# Patient Record
Sex: Female | Born: 1940 | Race: White | Hispanic: No | Smoking: Never smoker
Health system: Southern US, Community
[De-identification: ages and names within clinical notes are randomized; demographics above are authoritative.]

## PROBLEM LIST (undated history)

## (undated) DIAGNOSIS — I10 Essential (primary) hypertension: Secondary | ICD-10-CM

## (undated) DIAGNOSIS — M5431 Sciatica, right side: Secondary | ICD-10-CM

## (undated) DIAGNOSIS — H409 Unspecified glaucoma: Secondary | ICD-10-CM

## (undated) DIAGNOSIS — J45909 Unspecified asthma, uncomplicated: Secondary | ICD-10-CM

## (undated) DIAGNOSIS — T7840XA Allergy, unspecified, initial encounter: Secondary | ICD-10-CM

## (undated) DIAGNOSIS — N183 Chronic kidney disease, stage 3 unspecified: Secondary | ICD-10-CM

## (undated) DIAGNOSIS — A809 Acute poliomyelitis, unspecified: Secondary | ICD-10-CM

## (undated) HISTORY — DX: Acute poliomyelitis, unspecified: A80.9

## (undated) HISTORY — DX: Allergy, unspecified, initial encounter: T78.40XA

## (undated) HISTORY — DX: Sciatica, right side: M54.31

## (undated) HISTORY — DX: Chronic kidney disease, stage 3 (moderate): N18.3

## (undated) HISTORY — DX: Chronic kidney disease, stage 3 unspecified: N18.30

## (undated) HISTORY — DX: Unspecified glaucoma: H40.9

## (undated) HISTORY — DX: Unspecified asthma, uncomplicated: J45.909

## (undated) HISTORY — DX: Essential (primary) hypertension: I10

---

## 1992-10-09 HISTORY — PX: ROTATOR CUFF REPAIR: SHX139

## 1999-01-06 ENCOUNTER — Other Ambulatory Visit: Admission: RE | Admit: 1999-01-06 | Discharge: 1999-01-06 | Payer: Self-pay | Admitting: Obstetrics and Gynecology

## 2000-02-14 ENCOUNTER — Other Ambulatory Visit: Admission: RE | Admit: 2000-02-14 | Discharge: 2000-02-14 | Payer: Self-pay | Admitting: Obstetrics and Gynecology

## 2000-10-09 HISTORY — PX: CHOLECYSTECTOMY: SHX55

## 2001-05-09 ENCOUNTER — Other Ambulatory Visit: Admission: RE | Admit: 2001-05-09 | Discharge: 2001-05-09 | Payer: Self-pay | Admitting: Obstetrics and Gynecology

## 2002-04-28 ENCOUNTER — Encounter: Payer: Self-pay | Admitting: Surgery

## 2002-04-30 ENCOUNTER — Encounter (INDEPENDENT_AMBULATORY_CARE_PROVIDER_SITE_OTHER): Payer: Self-pay | Admitting: Specialist

## 2002-04-30 ENCOUNTER — Observation Stay (HOSPITAL_COMMUNITY): Admission: RE | Admit: 2002-04-30 | Discharge: 2002-05-01 | Payer: Self-pay | Admitting: Surgery

## 2002-04-30 ENCOUNTER — Encounter: Payer: Self-pay | Admitting: Surgery

## 2002-05-13 ENCOUNTER — Other Ambulatory Visit: Admission: RE | Admit: 2002-05-13 | Discharge: 2002-05-13 | Payer: Self-pay | Admitting: Obstetrics and Gynecology

## 2003-07-03 ENCOUNTER — Other Ambulatory Visit: Admission: RE | Admit: 2003-07-03 | Discharge: 2003-07-03 | Payer: Self-pay | Admitting: Obstetrics and Gynecology

## 2004-07-05 ENCOUNTER — Other Ambulatory Visit: Admission: RE | Admit: 2004-07-05 | Discharge: 2004-07-05 | Payer: Self-pay | Admitting: Obstetrics and Gynecology

## 2005-03-15 ENCOUNTER — Ambulatory Visit: Payer: Self-pay | Admitting: Family Medicine

## 2005-03-29 ENCOUNTER — Ambulatory Visit: Payer: Self-pay | Admitting: Family Medicine

## 2005-07-07 ENCOUNTER — Other Ambulatory Visit: Admission: RE | Admit: 2005-07-07 | Discharge: 2005-07-07 | Payer: Self-pay | Admitting: Obstetrics and Gynecology

## 2005-12-05 ENCOUNTER — Ambulatory Visit: Payer: Self-pay | Admitting: Family Medicine

## 2005-12-08 ENCOUNTER — Ambulatory Visit: Payer: Self-pay | Admitting: Cardiology

## 2005-12-21 ENCOUNTER — Ambulatory Visit: Payer: Self-pay | Admitting: Gastroenterology

## 2005-12-27 ENCOUNTER — Ambulatory Visit: Payer: Self-pay | Admitting: Gastroenterology

## 2005-12-27 ENCOUNTER — Encounter (INDEPENDENT_AMBULATORY_CARE_PROVIDER_SITE_OTHER): Payer: Self-pay | Admitting: *Deleted

## 2006-03-27 ENCOUNTER — Ambulatory Visit: Payer: Self-pay | Admitting: Family Medicine

## 2006-08-09 LAB — CONVERTED CEMR LAB: Pap Smear: NORMAL

## 2007-05-24 DIAGNOSIS — I10 Essential (primary) hypertension: Secondary | ICD-10-CM | POA: Insufficient documentation

## 2007-05-24 DIAGNOSIS — A809 Acute poliomyelitis, unspecified: Secondary | ICD-10-CM | POA: Insufficient documentation

## 2007-05-24 DIAGNOSIS — E785 Hyperlipidemia, unspecified: Secondary | ICD-10-CM | POA: Insufficient documentation

## 2007-09-17 ENCOUNTER — Ambulatory Visit: Payer: Self-pay | Admitting: Family Medicine

## 2007-09-17 DIAGNOSIS — E669 Obesity, unspecified: Secondary | ICD-10-CM | POA: Insufficient documentation

## 2007-09-19 LAB — CONVERTED CEMR LAB
ALT: 19 units/L (ref 0–35)
AST: 19 units/L (ref 0–37)
BUN: 12 mg/dL (ref 6–23)
Basophils Relative: 0.7 % (ref 0.0–1.0)
CO2: 30 meq/L (ref 19–32)
Calcium: 9.6 mg/dL (ref 8.4–10.5)
Creatinine, Ser: 1 mg/dL (ref 0.4–1.2)
Eosinophils Relative: 2 % (ref 0.0–5.0)
Glucose, Bld: 96 mg/dL (ref 70–99)
HCT: 40.8 % (ref 36.0–46.0)
Lymphocytes Relative: 31.8 % (ref 12.0–46.0)
MCV: 89.8 fL (ref 78.0–100.0)
Neutro Abs: 3.1 10*3/uL (ref 1.4–7.7)
Neutrophils Relative %: 55.1 % (ref 43.0–77.0)
Platelets: 336 10*3/uL (ref 150–400)
RBC: 4.54 M/uL (ref 3.87–5.11)
Triglycerides: 107 mg/dL (ref 0–149)
VLDL: 21 mg/dL (ref 0–40)
WBC: 5.5 10*3/uL (ref 4.5–10.5)

## 2010-10-09 HISTORY — PX: COSMETIC SURGERY: SHX468

## 2011-02-24 NOTE — Op Note (Signed)
[facility]  Patient:    Yvonne Rich, Yvonne Rich Visit Number: [id number] MRN: [id number]          Service Type: SUR Location: [id number] Attending Physician:  Charlton Haws Dictated by:   Currie Paris, M.D. Proc. Date: 04/30/02 Admit Date:  04/30/2002 Discharge Date: 05/01/2002   CC:         Roxy Manns, M.D. [facility]   Operative Report  CCS# [id number]  PREOPERATIVE DIAGNOSIS:  Chronic calculus cholecystitis.  POSTOPERATIVE DIAGNOSIS:  Chronic calculus cholecystitis.  OPERATION PERFORMED:  Laparoscopic cholecystectomy with intraoperative cholangiogram.  SURGEON:  Currie Paris, M.D.  ASSISTANT:  Donnie Coffin. Samuella Cota, M.D.  ANESTHESIA:  General endotracheal.  INDICATIONS FOR PROCEDURE:  The patient is a 70 year old lady with biliary type symptoms having ultrasound that showed several stones.  The common bile duct looked normal.  After discussion with the patient we elected to proceed with laparoscopic cholecystectomy.  DESCRIPTION OF PROCEDURE:  The patient was seen in the holding area and had no further questions.  Preoperative laboratory studies were noted.  She had normal liver functions.  The patient was taken to the operating room and after satisfactory general endotracheal anesthesia had been obtained, the abdomen was prepped and draped. 0.25% plain Marcaine was used for local to help with postoperative analgesia. Each incision site was infiltrated.  The umbilical incision was made first and the fascia opened and the peritoneal cavity entered by direct vision.  A pursestring suture was placed and the Hasson introduced.  The abdomen was insufflated to 15.  A cannula was placed and no gross abnormalities were noted.  The patient was placed in reversed Trendelenburg position, tilted to the left.  A 10 mm trocar was placed in the epigastrium and two 5 mm laterally.  The gallbladder was retracted over the liver and a few  omental adhesions taken down.  The peritoneum over the cystic duct was opened and the triangle of Calot exposed.  I could easily see the cystic duct and the cystic artery, got those both dissected out and made sure there were no other structures in the triangle.  I placed a single clip on the cystic duct at the junction with the gallbladder and another one on the artery.  I opened the cystic duct and inserted a catheter for cholangiography.  This was held with a clip.  Operative cholangiogram was basically normal with good flow in the duodenum, good filling of the common duct.  No filling defects.  There did appear to be an extra right hepatic duct draining separately into the common duct above the cystic duct.  Once the cholangiogram was reviewed the catheter was reinsufflated.  The catheter was removed and three clips placed on the stay side of the cystic duct and it was divided.  Two more clips were placed on the cystic artery and it was divided leaving 2 clips on the stay side.  Further dissection showed a few what appeared to be lymphatics which were clipped once and then divided and the gallbladder removed from below to above with coagulation current of the cautery.  Just prior to disconnecting the gallbladder, we irrigated and everything appeared to be completely dry with no blood or bile leaks noted.  The gallbladder was disconnected and brought out the umbilical port.  The umbilical port was occluded for a few moments while we reinsufflated and irrigated and again checked for hemostasis and again, everything appeared okay.  The lateral ports were removed and  there was not bleeding.  The umbilical port was closed under direct vision with the camera in the epigastric port.  The abdomen was deflated through the epigastric port.  The skin was closed with 4-0 Monocryl subcuticular plus Steri-Strips.  The patient tolerated the procedure well.  There were no operative complications.   All counts were correct. Dictated by:   Currie Paris, M.D. Attending Physician:  Charlton Haws DD:  04/30/02 TD:  05/01/02 Job: [id number]

## 2011-10-06 ENCOUNTER — Other Ambulatory Visit: Payer: Self-pay

## 2011-10-19 ENCOUNTER — Ambulatory Visit: Payer: Self-pay

## 2012-07-11 ENCOUNTER — Ambulatory Visit: Payer: Self-pay | Admitting: Internal Medicine

## 2012-07-24 DIAGNOSIS — D8989 Other specified disorders involving the immune mechanism, not elsewhere classified: Secondary | ICD-10-CM | POA: Insufficient documentation

## 2012-08-06 ENCOUNTER — Ambulatory Visit: Payer: Self-pay | Admitting: Hematology and Oncology

## 2012-08-06 LAB — CBC CANCER CENTER
Basophil #: 0.1 x10 3/mm (ref 0.0–0.1)
Eosinophil #: 0.1 x10 3/mm (ref 0.0–0.7)
HCT: 42.4 % (ref 35.0–47.0)
HGB: 14.1 g/dL (ref 12.0–16.0)
Lymphocyte #: 1.5 x10 3/mm (ref 1.0–3.6)
Lymphocyte %: 28.4 %
MCHC: 33.2 g/dL (ref 32.0–36.0)
MCV: 93 fL (ref 80–100)
Neutrophil #: 2.9 x10 3/mm (ref 1.4–6.5)
Platelet: 299 x10 3/mm (ref 150–440)
RBC: 4.54 10*6/uL (ref 3.80–5.20)
RDW: 12.8 % (ref 11.5–14.5)
WBC: 5.1 x10 3/mm (ref 3.6–11.0)

## 2012-08-06 LAB — BASIC METABOLIC PANEL
BUN: 17 mg/dL (ref 7–18)
Calcium, Total: 9.6 mg/dL (ref 8.5–10.1)
Chloride: 97 mmol/L — ABNORMAL LOW (ref 98–107)
Co2: 27 mmol/L (ref 21–32)
Creatinine: 1.43 mg/dL — ABNORMAL HIGH (ref 0.60–1.30)
EGFR (African American): 43 — ABNORMAL LOW
EGFR (Non-African Amer.): 37 — ABNORMAL LOW
Glucose: 92 mg/dL (ref 65–99)
Potassium: 4 mmol/L (ref 3.5–5.1)
Sodium: 136 mmol/L (ref 136–145)

## 2012-08-07 LAB — PROT IMMUNOELECTROPHORES(ARMC)

## 2012-08-07 LAB — KAPPA/LAMBDA FREE LIGHT CHAINS (ARMC)

## 2012-08-08 LAB — URINE IEP, RANDOM

## 2012-08-09 ENCOUNTER — Ambulatory Visit: Payer: Self-pay | Admitting: Hematology and Oncology

## 2012-08-21 LAB — BASIC METABOLIC PANEL
Calcium, Total: 9.5 mg/dL (ref 8.5–10.1)
Chloride: 100 mmol/L (ref 98–107)
Creatinine: 1.11 mg/dL (ref 0.60–1.30)
EGFR (African American): 58 — ABNORMAL LOW
EGFR (Non-African Amer.): 50 — ABNORMAL LOW
Glucose: 96 mg/dL (ref 65–99)
Osmolality: 269 (ref 275–301)
Potassium: 4.2 mmol/L (ref 3.5–5.1)
Sodium: 134 mmol/L — ABNORMAL LOW (ref 136–145)

## 2012-08-21 LAB — CBC CANCER CENTER
Eosinophil #: 0.1 x10 3/mm (ref 0.0–0.7)
HCT: 41.4 % (ref 35.0–47.0)
HGB: 14.1 g/dL (ref 12.0–16.0)
Lymphocyte %: 29 %
Monocyte #: 0.4 x10 3/mm (ref 0.2–0.9)
Monocyte %: 6.7 %
Platelet: 299 x10 3/mm (ref 150–440)
RDW: 13.1 % (ref 11.5–14.5)
WBC: 6.5 x10 3/mm (ref 3.6–11.0)

## 2012-09-08 ENCOUNTER — Ambulatory Visit: Payer: Self-pay | Admitting: Hematology and Oncology

## 2012-12-11 ENCOUNTER — Ambulatory Visit: Payer: Self-pay | Admitting: Gastroenterology

## 2014-06-26 ENCOUNTER — Other Ambulatory Visit: Payer: Self-pay | Admitting: Obstetrics and Gynecology

## 2014-06-30 LAB — CYTOLOGY - PAP

## 2014-10-22 ENCOUNTER — Other Ambulatory Visit: Payer: Self-pay | Admitting: Nurse Practitioner

## 2014-10-22 ENCOUNTER — Ambulatory Visit
Admission: RE | Admit: 2014-10-22 | Discharge: 2014-10-22 | Disposition: A | Discharge status: Home / Self Care | Payer: Medicare Other | Source: Ambulatory Visit | Attending: Nurse Practitioner | Admitting: Nurse Practitioner

## 2014-10-22 DIAGNOSIS — R221 Localized swelling, mass and lump, neck: Secondary | ICD-10-CM

## 2015-06-04 ENCOUNTER — Encounter: Payer: Self-pay | Admitting: Family Medicine

## 2015-06-04 DIAGNOSIS — J45909 Unspecified asthma, uncomplicated: Secondary | ICD-10-CM | POA: Insufficient documentation

## 2015-06-04 DIAGNOSIS — T7840XA Allergy, unspecified, initial encounter: Secondary | ICD-10-CM | POA: Insufficient documentation

## 2015-06-04 DIAGNOSIS — H409 Unspecified glaucoma: Secondary | ICD-10-CM | POA: Insufficient documentation

## 2015-06-15 ENCOUNTER — Ambulatory Visit (INDEPENDENT_AMBULATORY_CARE_PROVIDER_SITE_OTHER): Payer: Medicare Other | Admitting: Family Medicine

## 2015-06-15 ENCOUNTER — Encounter: Payer: Self-pay | Admitting: Family Medicine

## 2015-06-15 VITALS — BP 142/80 | HR 74 | Temp 98.6°F | Resp 18 | Ht 69.0 in | Wt 229.0 lb

## 2015-06-15 DIAGNOSIS — M5431 Sciatica, right side: Secondary | ICD-10-CM | POA: Diagnosis not present

## 2015-06-15 DIAGNOSIS — Z7189 Other specified counseling: Secondary | ICD-10-CM | POA: Diagnosis not present

## 2015-06-15 DIAGNOSIS — Z7689 Persons encountering health services in other specified circumstances: Secondary | ICD-10-CM

## 2015-06-15 DIAGNOSIS — E785 Hyperlipidemia, unspecified: Secondary | ICD-10-CM

## 2015-06-15 DIAGNOSIS — I10 Essential (primary) hypertension: Secondary | ICD-10-CM

## 2015-06-15 NOTE — Progress Notes (Signed)
Subjective:    Patient ID: Yvonne Rich, female    DOB: [birth year], 74 y.o.   MRN: [id number]  HPI Patient is here today to establish care. 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 She also has a history of hypertension, hyperlipidemia, allergy, asthma, and borderline glaucoma. Her mammogram is performed every December by her gynecologist. She sees her gynecologist for Pap smears and pelvic exams. Her last colonoscopy was performed in 2013 and is good until 2023. He is due for a bone density but she would like to defer this until she sees her gynecologist. She is due for a flu shot, Prevnar 13, shingles vaccine: Tetanus shot, and a flu shot but she declines all these vaccinations today. She does report a 2 year history of residual pain in her right leg radiating from her gluteus down to her posterior thigh into her right calf down into her right foot. The pain is burning and searing in nature. She is tried physical therapy without benefit. She is tried repeated dose packs a prednisone without benefit. She is even seen an acupuncturist without benefit. She has never had any imaging of the  Lumbar spine. Again the symptoms have been ongoing for 2 years. Past Medical History  Diagnosis Date  . Allergy   . Asthma   . Glaucoma   . Hypertension   . Poliomyelitis   . Sciatica of right side    Past Surgical History  Procedure Laterality Date  . Cosmetic surgery  2012    eye lids  . Cholecystectomy  2002  . Rotator cuff repair  1994   Current Outpatient Prescriptions on File Prior to Visit  Medication Sig Dispense Refill  . albuterol (PROAIR HFA) 108 (90 BASE) MCG/ACT inhaler Inhale 2 puffs into the lungs every 6 (six) hours as needed for wheezing or shortness of breath.    Marland Kitchen aspirin 81 MG tablet Take 81 mg by mouth daily.    . Esomeprazole Magnesium (NEXIUM PO) Take 22.5 mg by mouth daily.    Marland Kitchen losartan-hydrochlorothiazide (HYZAAR) 100-25  MG per tablet Take 1 tablet by mouth daily.    . meloxicam (MOBIC) 7.5 MG tablet Take 7.5 mg by mouth daily as needed for pain.    . metoprolol tartrate (LOPRESSOR) 25 MG tablet Take 25 mg by mouth 2 (two) times daily.    . Probiotic Product (PROBIOTIC DAILY PO) Take by mouth.    . vitamin E 400 UNIT capsule Take 400 Units by mouth daily.     No current facility-administered medications on file prior to visit.   Allergies  Allergen Reactions  . Erythromycin     REACTION: rash  . Sulfonamide Derivatives     REACTION: rash   Social History   Social History  . Marital Status: Married    Spouse Name: N/A  . Number of Children: N/A  . Years of Education: N/A   Occupational History  . Not on file.   Social History Main Topics  . Smoking status: Never Smoker   . Smokeless tobacco: Never Used  . Alcohol Use: No  . Drug Use: No  . Sexual Activity: Yes   Other Topics Concern  . Not on file   Social History Narrative   Family History  Problem Relation Age of Onset  . Hypertension Father   . Vision loss Father       Review of Systems  All other systems reviewed and are negative.  Objective:   Physical Exam  Constitutional: She is oriented to person, place, and time. She appears well-developed and well-nourished. No distress.  HENT:  Head: Normocephalic and atraumatic.  Right Ear: External ear normal.  Left Ear: External ear normal.  Nose: Nose normal.  Mouth/Throat: Oropharynx is clear and moist. No oropharyngeal exudate.  Eyes: Conjunctivae and EOM are normal. Pupils are equal, round, and reactive to light. Right eye exhibits no discharge. Left eye exhibits no discharge. No scleral icterus.  Neck: Normal range of motion. Neck supple. No JVD present. No tracheal deviation present. No thyromegaly present.  Cardiovascular: Normal rate, regular rhythm, normal heart sounds and intact distal pulses.  Exam reveals no gallop and no friction rub.   No murmur  heard. Pulmonary/Chest: Effort normal and breath sounds normal. No stridor. No respiratory distress. She has no wheezes. She has no rales. She exhibits no tenderness.  Abdominal: Soft. Bowel sounds are normal. She exhibits no distension and no mass. There is no tenderness. There is no rebound and no guarding.  Musculoskeletal: Normal range of motion. She exhibits no edema or tenderness.  Lymphadenopathy:    She has no cervical adenopathy.  Neurological: She is alert and oriented to person, place, and time. She displays normal reflexes. No cranial nerve deficit. She exhibits normal muscle tone. Coordination normal.  Skin: She is not diaphoretic.  Psychiatric: She has a normal mood and affect. Her behavior is normal. Judgment and thought content normal.  Vitals reviewed.         Assessment & Plan:  Establishing care with new doctor, encounter for  Right sided sciatica - Plan: MR Lumbar Spine Wo Contrast  Benign essential HTN  HLD (hyperlipidemia)  I will schedule the patient for an MRI of her lumbar spine. I would likely suggest using gabapentin 300 mg by mouth 3 times a day versus epidural sterile injections. I recommended a flu shot, tetanus shot, Prevnar 13. The patient declined these vaccinations today. She will see her gynecologist in December for a mammogram as well as her bone density test. I would like her to return fasting for CBC, CMP, fasting lipid panel.

## 2015-06-27 ENCOUNTER — Ambulatory Visit
Admission: RE | Admit: 2015-06-27 | Discharge: 2015-06-27 | Disposition: A | Discharge status: Home / Self Care | Payer: Medicare Other | Source: Ambulatory Visit | Attending: Family Medicine | Admitting: Family Medicine

## 2015-06-27 DIAGNOSIS — M5431 Sciatica, right side: Secondary | ICD-10-CM

## 2015-06-28 ENCOUNTER — Other Ambulatory Visit: Payer: Self-pay | Admitting: Family Medicine

## 2015-06-28 MED ORDER — GABAPENTIN 300 MG PO CAPS: 300.0000 mg | ORAL_CAPSULE | Freq: Three times a day (TID) | ORAL | Status: DC

## 2015-10-01 ENCOUNTER — Ambulatory Visit: Payer: Medicare Other

## 2015-10-01 LAB — CBC WITH DIFFERENTIAL/PLATELET
Basophils Absolute: 0.1 10*3/uL (ref 0.0–0.1)
Basophils Relative: 1 % (ref 0–1)
EOS ABS: 0.1 10*3/uL (ref 0.0–0.7)
Eosinophils Relative: 1 % (ref 0–5)
HCT: 40.3 % (ref 36.0–46.0)
HEMOGLOBIN: 14 g/dL (ref 12.0–15.0)
LYMPHS ABS: 1.8 10*3/uL (ref 0.7–4.0)
Lymphocytes Relative: 35 % (ref 12–46)
MCH: 32 pg (ref 26.0–34.0)
MCHC: 34.7 g/dL (ref 30.0–36.0)
MCV: 92 fL (ref 78.0–100.0)
MONO ABS: 0.5 10*3/uL (ref 0.1–1.0)
MONOS PCT: 10 % (ref 3–12)
MPV: 9.1 fL (ref 8.6–12.4)
NEUTROS PCT: 53 % (ref 43–77)
Neutro Abs: 2.7 10*3/uL (ref 1.7–7.7)
PLATELETS: 268 10*3/uL (ref 150–400)
RBC: 4.38 MIL/uL (ref 3.87–5.11)
RDW: 14.3 % (ref 11.5–15.5)
WBC: 5 10*3/uL (ref 4.0–10.5)

## 2015-10-01 LAB — COMPLETE METABOLIC PANEL WITH GFR
ALBUMIN: 4.2 g/dL (ref 3.6–5.1)
ALK PHOS: 57 U/L (ref 33–130)
ALT: 13 U/L (ref 6–29)
AST: 13 U/L (ref 10–35)
BILIRUBIN TOTAL: 0.7 mg/dL (ref 0.2–1.2)
BUN: 18 mg/dL (ref 7–25)
CO2: 27 mmol/L (ref 20–31)
Calcium: 9.9 mg/dL (ref 8.6–10.4)
Chloride: 95 mmol/L — ABNORMAL LOW (ref 98–110)
Creat: 1.14 mg/dL — ABNORMAL HIGH (ref 0.60–0.93)
GFR, EST AFRICAN AMERICAN: 55 mL/min — AB (ref >=60)
GFR, EST NON AFRICAN AMERICAN: 47 mL/min — AB (ref >=60)
Glucose, Bld: 83 mg/dL (ref 70–99)
Potassium: 4.2 mmol/L (ref 3.5–5.3)
Sodium: 132 mmol/L — ABNORMAL LOW (ref 135–146)
TOTAL PROTEIN: 6.5 g/dL (ref 6.1–8.1)

## 2015-10-01 LAB — LIPID PANEL
CHOLESTEROL: 186 mg/dL (ref 125–200)
HDL: 41 mg/dL — AB (ref >=46)
LDL Cholesterol: 115 mg/dL (ref <130)
TRIGLYCERIDES: 149 mg/dL (ref <150)
Total CHOL/HDL Ratio: 4.5 Ratio (ref <=5.0)
VLDL: 30 mg/dL (ref <30)

## 2015-10-05 ENCOUNTER — Encounter: Payer: Self-pay | Admitting: Family Medicine

## 2015-10-05 ENCOUNTER — Ambulatory Visit (INDEPENDENT_AMBULATORY_CARE_PROVIDER_SITE_OTHER): Payer: Medicare Other | Admitting: Family Medicine

## 2015-10-05 VITALS — BP 130/70 | HR 80 | Temp 98.3°F | Resp 16 | Ht 69.0 in | Wt 228.0 lb

## 2015-10-05 DIAGNOSIS — M5431 Sciatica, right side: Secondary | ICD-10-CM | POA: Diagnosis not present

## 2015-10-05 DIAGNOSIS — I10 Essential (primary) hypertension: Secondary | ICD-10-CM | POA: Diagnosis not present

## 2015-10-05 MED ORDER — MELOXICAM 7.5 MG PO TABS: 7.5000 mg | ORAL_TABLET | Freq: Every day | ORAL | Status: DC | PRN

## 2015-10-05 MED ORDER — ALBUTEROL SULFATE HFA 108 (90 BASE) MCG/ACT IN AERS: 2.0000 | INHALATION_SPRAY | Freq: Four times a day (QID) | RESPIRATORY_TRACT | Status: DC | PRN

## 2015-10-05 MED ORDER — METOPROLOL TARTRATE 25 MG PO TABS: 25.0000 mg | ORAL_TABLET | Freq: Two times a day (BID) | ORAL | Status: DC

## 2015-10-05 MED ORDER — LOSARTAN POTASSIUM-HCTZ 100-25 MG PO TABS: 1.0000 | ORAL_TABLET | Freq: Every day | ORAL | Status: DC

## 2015-10-05 NOTE — Progress Notes (Signed)
Subjective:    Patient ID: Yvonne Rich, female    DOB: 05-09-1941, 74 y.o.   MRN: [id number]  HPI 06/15/15 Patient is here today to establish care. 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 She also has a history of hypertension, hyperlipidemia, allergy, asthma, and borderline glaucoma. Her mammogram is performed every December by her gynecologist. She sees her gynecologist for Pap smears and pelvic exams. Her last colonoscopy was performed in 2013 and is good until 2023. He is due for a bone density but she would like to defer this until she sees her gynecologist. She is due for a flu shot, Prevnar 13, shingles vaccine: Tetanus shot, and a flu shot but she declines all these vaccinations today. She does report a 2 year history of residual pain in her right leg radiating from her gluteus down to her posterior thigh into her right calf down into her right foot. The pain is burning and searing in nature. She is tried physical therapy without benefit. She is tried repeated dose packs a prednisone without benefit. She is even seen an acupuncturist without benefit. She has never had any imaging of the  Lumbar spine. Again the symptoms have been ongoing for 2 years.  At that time, my plan was: I will schedule the patient for an MRI of her lumbar spine. I would likely suggest using gabapentin 300 mg by mouth 3 times a day versus epidural sterile injections. I recommended a flu shot, tetanus shot, Prevnar 13. The patient declined these vaccinations today. She will see her gynecologist in December for a mammogram as well as her bone density test. I would like her to return fasting for CBC, CMP, fasting lipid panel.  10/05/15 No visits with results within 1 Week(s) from this visit. Latest known visit with results is:  Office Visit on 06/15/2015  Component Date Value Ref Range Status  . WBC 10/01/2015 5.0  4.0 - 10.5 K/uL Final  . RBC 10/01/2015 4.38  3.87  - 5.11 MIL/uL Final  . Hemoglobin 10/01/2015 14.0  12.0 - 15.0 g/dL Final  . HCT 10/01/2015 40.3  36.0 - 46.0 % Final  . MCV 10/01/2015 92.0  78.0 - 100.0 fL Final  . MCH 10/01/2015 32.0  26.0 - 34.0 pg Final  . MCHC 10/01/2015 34.7  30.0 - 36.0 g/dL Final  . RDW 10/01/2015 14.3  11.5 - 15.5 % Final  . Platelets 10/01/2015 268  150 - 400 K/uL Final  . MPV 10/01/2015 9.1  8.6 - 12.4 fL Final  . Neutrophils Relative % 10/01/2015 53  43 - 77 % Final  . Neutro Abs 10/01/2015 2.7  1.7 - 7.7 K/uL Final  . Lymphocytes Relative 10/01/2015 35  12 - 46 % Final  . Lymphs Abs 10/01/2015 1.8  0.7 - 4.0 K/uL Final  . Monocytes Relative 10/01/2015 10  3 - 12 % Final  . Monocytes Absolute 10/01/2015 0.5  0.1 - 1.0 K/uL Final  . Eosinophils Relative 10/01/2015 1  0 - 5 % Final  . Eosinophils Absolute 10/01/2015 0.1  0.0 - 0.7 K/uL Final  . Basophils Relative 10/01/2015 1  0 - 1 % Final  . Basophils Absolute 10/01/2015 0.1  0.0 - 0.1 K/uL Final  . Smear Review 10/01/2015 Criteria for review not met   Final  . Sodium 10/01/2015 132* 135 - 146 mmol/L Final  . Potassium 10/01/2015 4.2  3.5 - 5.3 mmol/L Final  . Chloride  10/01/2015 95* 98 - 110 mmol/L Final  . CO2 10/01/2015 27  20 - 31 mmol/L Final  . Glucose, Bld 10/01/2015 83  70 - 99 mg/dL Final  . BUN 10/01/2015 18  7 - 25 mg/dL Final  . Creat 10/01/2015 1.14* 0.60 - 0.93 mg/dL Final  . Total Bilirubin 10/01/2015 0.7  0.2 - 1.2 mg/dL Final  . Alkaline Phosphatase 10/01/2015 57  33 - 130 U/L Final  . AST 10/01/2015 13  10 - 35 U/L Final  . ALT 10/01/2015 13  6 - 29 U/L Final  . Total Protein 10/01/2015 6.5  6.1 - 8.1 g/dL Final  . Albumin 10/01/2015 4.2  3.6 - 5.1 g/dL Final  . Calcium 10/01/2015 9.9  8.6 - 10.4 mg/dL Final  . GFR, Est African American 10/01/2015 55* >=60 mL/min Final  . GFR, Est Non African American 10/01/2015 47* >=60 mL/min Final   Comment:   The estimated GFR is a calculation valid for adults (>=4 years old) that uses the  CKD-EPI algorithm to adjust for age and sex. It is   not to be used for children, pregnant women, hospitalized patients,    patients on dialysis, or with rapidly changing kidney function. According to the NKDEP, eGFR >89 is normal, 60-89 shows mild impairment, 30-59 shows moderate impairment, 15-29 shows severe impairment and <15 is ESRD.     Marland Kitchen Cholesterol 10/01/2015 186  125 - 200 mg/dL Final  . Triglycerides 10/01/2015 149  <150 mg/dL Final  . HDL 10/01/2015 41* >=46 mg/dL Final  . Total CHOL/HDL Ratio 10/01/2015 4.5  <=5.0 Ratio Final  . VLDL 10/01/2015 30  <30 mg/dL Final  . LDL Cholesterol 10/01/2015 115  <130 mg/dL Final   Comment:   Total Cholesterol/HDL Ratio:CHD Risk                        Coronary Heart Disease Risk Table                                        Men       Women          1/2 Average Risk              3.4        3.3              Average Risk              5.0        4.4           2X Average Risk              9.6        7.1           3X Average Risk             23.4       11.0 Use the calculated Patient Ratio above and the CHD Risk table  to determine the patient's CHD Risk.   Her MRI revealed: Best explanation for right radiculopathy is at L4-5 where there is moderate canal and subarticular recess stenoses. Bilateral L5 impingement may worsen with loading as there is advanced facet arthropathy and signs of instability.  Patient has not yet had use gabapentin. Her back is been doing relatively well. She mainly uses meloxicam 2-3 times per month. She does have  some mild chronic kidney disease so I have cautioned her against using NSAIDs regularly. She denies any chest pain shortness of breath or dyspnea on exertion. Her fasting lab work is excellent. Her blood pressure today is well controlled at 130/70. Past Medical History  Diagnosis Date  . Allergy   . Asthma   . Glaucoma   . Hypertension   . Poliomyelitis   . Sciatica of right side   . CKD (chronic  kidney disease) stage 3, GFR 30-59 ml/min    Past Surgical History  Procedure Laterality Date  . Cosmetic surgery  2012    eye lids  . Cholecystectomy  2002  . Rotator cuff repair  1994   Current Outpatient Prescriptions on File Prior to Visit  Medication Sig Dispense Refill  . albuterol (PROAIR HFA) 108 (90 BASE) MCG/ACT inhaler Inhale 2 puffs into the lungs every 6 (six) hours as needed for wheezing or shortness of breath.    Marland Kitchen aspirin 81 MG tablet Take 81 mg by mouth daily.    . Esomeprazole Magnesium (NEXIUM PO) Take 22.5 mg by mouth daily.    Marland Kitchen gabapentin (NEURONTIN) 300 MG capsule Take 1 capsule (300 mg total) by mouth 3 (three) times daily. 90 capsule 3  . loratadine (CLARITIN) 10 MG tablet Take 10 mg by mouth daily.    Marland Kitchen losartan-hydrochlorothiazide (HYZAAR) 100-25 MG per tablet Take 1 tablet by mouth daily.    . meloxicam (MOBIC) 7.5 MG tablet Take 7.5 mg by mouth daily as needed for pain.    . metoprolol tartrate (LOPRESSOR) 25 MG tablet Take 25 mg by mouth 2 (two) times daily.    . Probiotic Product (PROBIOTIC DAILY PO) Take by mouth.    . travoprost, benzalkonium, (TRAVATAN) 0.004 % ophthalmic solution Apply to eye.    . Vitamin D, Cholecalciferol, 400 UNITS TABS Take by mouth.    . vitamin E 400 UNIT capsule Take 400 Units by mouth daily.     No current facility-administered medications on file prior to visit.   Allergies  Allergen Reactions  . Erythromycin     REACTION: rash  . Sulfonamide Derivatives     REACTION: rash   Social History   Social History  . Marital Status: Married    Spouse Name: N/A  . Number of Children: N/A  . Years of Education: N/A   Occupational History  . Not on file.   Social History Main Topics  . Smoking status: Never Smoker   . Smokeless tobacco: Never Used  . Alcohol Use: No  . Drug Use: No  . Sexual Activity: Yes   Other Topics Concern  . Not on file   Social History Narrative   Family History  Problem Relation Age of  Onset  . Hypertension Father   . Vision loss Father       Review of Systems  All other systems reviewed and are negative.      Objective:   Physical Exam  Constitutional: She is oriented to person, place, and time. She appears well-developed and well-nourished. No distress.  HENT:  Head: Normocephalic and atraumatic.  Eyes: Conjunctivae are normal. Pupils are equal, round, and reactive to light.  Neck: Normal range of motion.  Cardiovascular: Normal rate, regular rhythm, normal heart sounds and intact distal pulses.  Exam reveals no gallop and no friction rub.   No murmur heard. Pulmonary/Chest: Effort normal and breath sounds normal. No respiratory distress. She has no wheezes. She has no rales. She exhibits  no tenderness.  Abdominal: Soft. Bowel sounds are normal. She exhibits no distension and no mass. There is no tenderness. There is no rebound and no guarding.  Musculoskeletal: Normal range of motion. She exhibits no edema.  Neurological: She is alert and oriented to person, place, and time. No cranial nerve deficit. She exhibits normal muscle tone. Coordination normal.  Skin: She is not diaphoretic.  Vitals reviewed.         Assessment & Plan:  Right sided sciatica  Benign essential HTN  Labs are excellent. Continue her current blood pressure medication at his present dose. She can use Tylenol daily if necessary for back pain. She can use gabapentin as needed for sciatica or nerve pain. I have cautioned the patient to use NSAIDs sparingly because of her chronic kidney disease.

## 2016-05-26 ENCOUNTER — Encounter: Payer: Self-pay | Admitting: Family Medicine

## 2016-07-19 ENCOUNTER — Other Ambulatory Visit: Payer: Self-pay | Admitting: Family Medicine

## 2016-10-13 ENCOUNTER — Inpatient Hospital Stay (HOSPITAL_COMMUNITY): Payer: Medicare HMO

## 2016-10-13 ENCOUNTER — Emergency Department (HOSPITAL_COMMUNITY): Payer: Medicare HMO

## 2016-10-13 ENCOUNTER — Encounter (HOSPITAL_COMMUNITY): Payer: Self-pay

## 2016-10-13 ENCOUNTER — Inpatient Hospital Stay (HOSPITAL_COMMUNITY)
Admission: EM | Admit: 2016-10-13 | Discharge: 2016-10-17 | DRG: 089 | Disposition: A | Discharge status: Skilled Nursing Facility | Payer: Medicare HMO | Attending: General Surgery | Admitting: General Surgery

## 2016-10-13 DIAGNOSIS — S82432A Displaced oblique fracture of shaft of left fibula, initial encounter for closed fracture: Secondary | ICD-10-CM | POA: Diagnosis present

## 2016-10-13 DIAGNOSIS — Z9181 History of falling: Secondary | ICD-10-CM | POA: Diagnosis not present

## 2016-10-13 DIAGNOSIS — S92412A Displaced fracture of proximal phalanx of left great toe, initial encounter for closed fracture: Secondary | ICD-10-CM | POA: Diagnosis present

## 2016-10-13 DIAGNOSIS — M79671 Pain in right foot: Secondary | ICD-10-CM | POA: Diagnosis not present

## 2016-10-13 DIAGNOSIS — Z882 Allergy status to sulfonamides status: Secondary | ICD-10-CM

## 2016-10-13 DIAGNOSIS — Z881 Allergy status to other antibiotic agents status: Secondary | ICD-10-CM | POA: Diagnosis not present

## 2016-10-13 DIAGNOSIS — S92422A Displaced fracture of distal phalanx of left great toe, initial encounter for closed fracture: Secondary | ICD-10-CM | POA: Diagnosis present

## 2016-10-13 DIAGNOSIS — J45909 Unspecified asthma, uncomplicated: Secondary | ICD-10-CM | POA: Diagnosis present

## 2016-10-13 DIAGNOSIS — W109XXA Fall (on) (from) unspecified stairs and steps, initial encounter: Secondary | ICD-10-CM | POA: Diagnosis present

## 2016-10-13 DIAGNOSIS — N183 Chronic kidney disease, stage 3 (moderate): Secondary | ICD-10-CM | POA: Diagnosis present

## 2016-10-13 DIAGNOSIS — M25571 Pain in right ankle and joints of right foot: Secondary | ICD-10-CM | POA: Diagnosis not present

## 2016-10-13 DIAGNOSIS — S0101XA Laceration without foreign body of scalp, initial encounter: Secondary | ICD-10-CM | POA: Diagnosis present

## 2016-10-13 DIAGNOSIS — N390 Urinary tract infection, site not specified: Secondary | ICD-10-CM | POA: Diagnosis not present

## 2016-10-13 DIAGNOSIS — E669 Obesity, unspecified: Secondary | ICD-10-CM | POA: Diagnosis present

## 2016-10-13 DIAGNOSIS — S020XXB Fracture of vault of skull, initial encounter for open fracture: Secondary | ICD-10-CM | POA: Diagnosis not present

## 2016-10-13 DIAGNOSIS — M6281 Muscle weakness (generalized): Secondary | ICD-10-CM | POA: Diagnosis not present

## 2016-10-13 DIAGNOSIS — Z6833 Body mass index (BMI) 33.0-33.9, adult: Secondary | ICD-10-CM

## 2016-10-13 DIAGNOSIS — R040 Epistaxis: Secondary | ICD-10-CM | POA: Diagnosis present

## 2016-10-13 DIAGNOSIS — I129 Hypertensive chronic kidney disease with stage 1 through stage 4 chronic kidney disease, or unspecified chronic kidney disease: Secondary | ICD-10-CM | POA: Diagnosis present

## 2016-10-13 DIAGNOSIS — W19XXXA Unspecified fall, initial encounter: Secondary | ICD-10-CM | POA: Diagnosis present

## 2016-10-13 DIAGNOSIS — S02401A Maxillary fracture, unspecified, initial encounter for closed fracture: Secondary | ICD-10-CM | POA: Diagnosis not present

## 2016-10-13 DIAGNOSIS — S82832A Other fracture of upper and lower end of left fibula, initial encounter for closed fracture: Secondary | ICD-10-CM

## 2016-10-13 DIAGNOSIS — S0291XB Unspecified fracture of skull, initial encounter for open fracture: Secondary | ICD-10-CM

## 2016-10-13 DIAGNOSIS — H53149 Visual discomfort, unspecified: Secondary | ICD-10-CM | POA: Diagnosis present

## 2016-10-13 DIAGNOSIS — S060X1A Concussion with loss of consciousness of 30 minutes or less, initial encounter: Secondary | ICD-10-CM | POA: Diagnosis not present

## 2016-10-13 DIAGNOSIS — Z7982 Long term (current) use of aspirin: Secondary | ICD-10-CM

## 2016-10-13 DIAGNOSIS — Z91040 Latex allergy status: Secondary | ICD-10-CM

## 2016-10-13 DIAGNOSIS — M542 Cervicalgia: Secondary | ICD-10-CM | POA: Diagnosis not present

## 2016-10-13 DIAGNOSIS — S92422D Displaced fracture of distal phalanx of left great toe, subsequent encounter for fracture with routine healing: Secondary | ICD-10-CM | POA: Diagnosis not present

## 2016-10-13 DIAGNOSIS — S0240EA Zygomatic fracture, right side, initial encounter for closed fracture: Secondary | ICD-10-CM | POA: Diagnosis present

## 2016-10-13 DIAGNOSIS — S40011A Contusion of right shoulder, initial encounter: Secondary | ICD-10-CM

## 2016-10-13 DIAGNOSIS — S82232D Displaced oblique fracture of shaft of left tibia, subsequent encounter for closed fracture with routine healing: Secondary | ICD-10-CM | POA: Diagnosis not present

## 2016-10-13 DIAGNOSIS — T148XXA Other injury of unspecified body region, initial encounter: Secondary | ICD-10-CM | POA: Diagnosis not present

## 2016-10-13 DIAGNOSIS — R262 Difficulty in walking, not elsewhere classified: Secondary | ICD-10-CM

## 2016-10-13 DIAGNOSIS — E785 Hyperlipidemia, unspecified: Secondary | ICD-10-CM | POA: Diagnosis present

## 2016-10-13 DIAGNOSIS — H409 Unspecified glaucoma: Secondary | ICD-10-CM | POA: Diagnosis present

## 2016-10-13 DIAGNOSIS — S82892A Other fracture of left lower leg, initial encounter for closed fracture: Secondary | ICD-10-CM | POA: Diagnosis not present

## 2016-10-13 DIAGNOSIS — S0292XA Unspecified fracture of facial bones, initial encounter for closed fracture: Secondary | ICD-10-CM | POA: Diagnosis present

## 2016-10-13 DIAGNOSIS — S0240CD Maxillary fracture, right side, subsequent encounter for fracture with routine healing: Secondary | ICD-10-CM | POA: Diagnosis not present

## 2016-10-13 DIAGNOSIS — Z23 Encounter for immunization: Secondary | ICD-10-CM | POA: Diagnosis not present

## 2016-10-13 DIAGNOSIS — S060X9A Concussion with loss of consciousness of unspecified duration, initial encounter: Principal | ICD-10-CM | POA: Diagnosis present

## 2016-10-13 DIAGNOSIS — S199XXA Unspecified injury of neck, initial encounter: Secondary | ICD-10-CM | POA: Diagnosis not present

## 2016-10-13 DIAGNOSIS — S0990XA Unspecified injury of head, initial encounter: Secondary | ICD-10-CM | POA: Diagnosis not present

## 2016-10-13 DIAGNOSIS — S0100XA Unspecified open wound of scalp, initial encounter: Secondary | ICD-10-CM | POA: Diagnosis not present

## 2016-10-13 DIAGNOSIS — S4991XA Unspecified injury of right shoulder and upper arm, initial encounter: Secondary | ICD-10-CM | POA: Diagnosis not present

## 2016-10-13 DIAGNOSIS — R413 Other amnesia: Secondary | ICD-10-CM | POA: Diagnosis present

## 2016-10-13 DIAGNOSIS — Z79899 Other long term (current) drug therapy: Secondary | ICD-10-CM | POA: Diagnosis not present

## 2016-10-13 DIAGNOSIS — R278 Other lack of coordination: Secondary | ICD-10-CM | POA: Diagnosis not present

## 2016-10-13 DIAGNOSIS — S0240ED Zygomatic fracture, right side, subsequent encounter for fracture with routine healing: Secondary | ICD-10-CM | POA: Diagnosis not present

## 2016-10-13 DIAGNOSIS — S060XAA Concussion with loss of consciousness status unknown, initial encounter: Secondary | ICD-10-CM | POA: Diagnosis present

## 2016-10-13 DIAGNOSIS — R51 Headache: Secondary | ICD-10-CM | POA: Diagnosis present

## 2016-10-13 DIAGNOSIS — M25872 Other specified joint disorders, left ankle and foot: Secondary | ICD-10-CM | POA: Diagnosis not present

## 2016-10-13 DIAGNOSIS — S0240AA Malar fracture, right side, initial encounter for closed fracture: Secondary | ICD-10-CM | POA: Diagnosis not present

## 2016-10-13 DIAGNOSIS — M8580 Other specified disorders of bone density and structure, unspecified site: Secondary | ICD-10-CM | POA: Diagnosis not present

## 2016-10-13 DIAGNOSIS — S40211A Abrasion of right shoulder, initial encounter: Secondary | ICD-10-CM | POA: Diagnosis present

## 2016-10-13 DIAGNOSIS — Y92008 Other place in unspecified non-institutional (private) residence as the place of occurrence of the external cause: Secondary | ICD-10-CM | POA: Diagnosis not present

## 2016-10-13 DIAGNOSIS — S0281XD Fracture of other specified skull and facial bones, right side, subsequent encounter for fracture with routine healing: Secondary | ICD-10-CM | POA: Diagnosis not present

## 2016-10-13 DIAGNOSIS — S020XXA Fracture of vault of skull, initial encounter for closed fracture: Secondary | ICD-10-CM | POA: Diagnosis present

## 2016-10-13 DIAGNOSIS — S99921A Unspecified injury of right foot, initial encounter: Secondary | ICD-10-CM | POA: Diagnosis not present

## 2016-10-13 DIAGNOSIS — S99911A Unspecified injury of right ankle, initial encounter: Secondary | ICD-10-CM | POA: Diagnosis not present

## 2016-10-13 DIAGNOSIS — S82892D Other fracture of left lower leg, subsequent encounter for closed fracture with routine healing: Secondary | ICD-10-CM | POA: Diagnosis present

## 2016-10-13 DIAGNOSIS — M25511 Pain in right shoulder: Secondary | ICD-10-CM | POA: Diagnosis not present

## 2016-10-13 DIAGNOSIS — M79676 Pain in unspecified toe(s): Secondary | ICD-10-CM

## 2016-10-13 LAB — CBC WITH DIFFERENTIAL/PLATELET
Basophils Absolute: 0 10*3/uL (ref 0.0–0.1)
Basophils Relative: 0 %
EOS ABS: 0 10*3/uL (ref 0.0–0.7)
EOS PCT: 0 %
HCT: 40.6 % (ref 36.0–46.0)
Hemoglobin: 14.3 g/dL (ref 12.0–15.0)
LYMPHS ABS: 0.7 10*3/uL (ref 0.7–4.0)
Lymphocytes Relative: 5 %
MCH: 32.4 pg (ref 26.0–34.0)
MCHC: 35.2 g/dL (ref 30.0–36.0)
MCV: 92.1 fL (ref 78.0–100.0)
Monocytes Absolute: 0.5 10*3/uL (ref 0.1–1.0)
Monocytes Relative: 4 %
Neutro Abs: 12.5 10*3/uL — ABNORMAL HIGH (ref 1.7–7.7)
Neutrophils Relative %: 91 %
PLATELETS: 297 10*3/uL (ref 150–400)
RBC: 4.41 MIL/uL (ref 3.87–5.11)
RDW: 12.7 % (ref 11.5–15.5)
WBC: 13.8 10*3/uL — AB (ref 4.0–10.5)

## 2016-10-13 LAB — COMPREHENSIVE METABOLIC PANEL
ALT: 24 U/L (ref 14–54)
ANION GAP: 11 (ref 5–15)
AST: 49 U/L — ABNORMAL HIGH (ref 15–41)
Albumin: 4.1 g/dL (ref 3.5–5.0)
Alkaline Phosphatase: 57 U/L (ref 38–126)
BUN: 13 mg/dL (ref 6–20)
CHLORIDE: 97 mmol/L — AB (ref 101–111)
CO2: 25 mmol/L (ref 22–32)
CREATININE: 1.16 mg/dL — AB (ref 0.44–1.00)
Calcium: 10.3 mg/dL (ref 8.9–10.3)
GFR calc non Af Amer: 45 mL/min — ABNORMAL LOW (ref >60)
GFR, EST AFRICAN AMERICAN: 52 mL/min — AB (ref >60)
Glucose, Bld: 127 mg/dL — ABNORMAL HIGH (ref 65–99)
POTASSIUM: 3.7 mmol/L (ref 3.5–5.1)
SODIUM: 133 mmol/L — AB (ref 135–145)
Total Bilirubin: 1 mg/dL (ref 0.3–1.2)
Total Protein: 7.2 g/dL (ref 6.5–8.1)

## 2016-10-13 LAB — URINALYSIS, ROUTINE W REFLEX MICROSCOPIC
BILIRUBIN URINE: NEGATIVE
Glucose, UA: NEGATIVE mg/dL
Ketones, ur: NEGATIVE mg/dL
NITRITE: NEGATIVE
PROTEIN: 30 mg/dL — AB
Specific Gravity, Urine: 1.017 (ref 1.005–1.030)
pH: 6 (ref 5.0–8.0)

## 2016-10-13 MED ORDER — KCL IN DEXTROSE-NACL 20-5-0.45 MEQ/L-%-% IV SOLN
INTRAVENOUS | Status: DC
  Administered 2016-10-15: 05:00:00 via INTRAVENOUS
  Filled 2016-10-13 (×3): qty 1000

## 2016-10-13 MED ORDER — LORATADINE 10 MG PO TABS
10.0000 mg | ORAL_TABLET | Freq: Every day | ORAL | Status: DC
  Administered 2016-10-14 – 2016-10-17 (×4): 10 mg via ORAL
  Filled 2016-10-13 (×4): qty 1

## 2016-10-13 MED ORDER — OXYCODONE HCL 5 MG PO TABS
5.0000 mg | ORAL_TABLET | ORAL | Status: DC | PRN
  Administered 2016-10-14 (×2): 5 mg via ORAL
  Filled 2016-10-13 (×2): qty 1

## 2016-10-13 MED ORDER — TETANUS-DIPHTH-ACELL PERTUSSIS 5-2.5-18.5 LF-MCG/0.5 IM SUSP
0.5000 mL | Freq: Once | INTRAMUSCULAR | Status: AC
  Administered 2016-10-13: 0.5 mL via INTRAMUSCULAR
  Filled 2016-10-13: qty 0.5

## 2016-10-13 MED ORDER — PANTOPRAZOLE SODIUM 40 MG IV SOLR
40.0000 mg | Freq: Every day | INTRAVENOUS | Status: DC
  Filled 2016-10-13: qty 40

## 2016-10-13 MED ORDER — ONDANSETRON HCL 4 MG/2ML IJ SOLN
INTRAMUSCULAR | Status: AC
  Administered 2016-10-13: 4 mg
  Filled 2016-10-13: qty 2

## 2016-10-13 MED ORDER — MORPHINE SULFATE (PF) 4 MG/ML IV SOLN
4.0000 mg | Freq: Once | INTRAVENOUS | Status: AC
  Administered 2016-10-13: 4 mg via INTRAVENOUS
  Filled 2016-10-13: qty 1

## 2016-10-13 MED ORDER — ONDANSETRON HCL 4 MG/2ML IJ SOLN
4.0000 mg | Freq: Once | INTRAMUSCULAR | Status: AC
  Administered 2016-10-13: 4 mg via INTRAVENOUS
  Filled 2016-10-13: qty 2

## 2016-10-13 MED ORDER — ACETAMINOPHEN 325 MG PO TABS
650.0000 mg | ORAL_TABLET | ORAL | Status: DC | PRN
  Administered 2016-10-13 – 2016-10-17 (×16): 650 mg via ORAL
  Filled 2016-10-13 (×16): qty 2

## 2016-10-13 MED ORDER — MORPHINE SULFATE (PF) 2 MG/ML IV SOLN
2.0000 mg | INTRAVENOUS | Status: DC | PRN
  Administered 2016-10-13 – 2016-10-14 (×5): 4 mg via INTRAVENOUS
  Filled 2016-10-13 (×5): qty 2

## 2016-10-13 MED ORDER — LIDOCAINE-EPINEPHRINE (PF) 2 %-1:200000 IJ SOLN
10.0000 mL | Freq: Once | INTRAMUSCULAR | Status: AC
  Administered 2016-10-13: 10 mL via INTRADERMAL
  Filled 2016-10-13: qty 20

## 2016-10-13 MED ORDER — ENOXAPARIN SODIUM 40 MG/0.4ML ~~LOC~~ SOLN
40.0000 mg | SUBCUTANEOUS | Status: DC
  Administered 2016-10-14 – 2016-10-17 (×4): 40 mg via SUBCUTANEOUS
  Filled 2016-10-13 (×4): qty 0.4

## 2016-10-13 MED ORDER — MORPHINE SULFATE (PF) 4 MG/ML IV SOLN
6.0000 mg | Freq: Once | INTRAVENOUS | Status: AC
  Administered 2016-10-13: 6 mg via INTRAVENOUS
  Filled 2016-10-13: qty 2

## 2016-10-13 MED ORDER — ONDANSETRON HCL 4 MG PO TABS
4.0000 mg | ORAL_TABLET | Freq: Four times a day (QID) | ORAL | Status: DC | PRN
  Administered 2016-10-14: 4 mg via ORAL
  Filled 2016-10-13: qty 1

## 2016-10-13 MED ORDER — DEXTROSE 5 % IV SOLN
1.0000 g | Freq: Once | INTRAVENOUS | Status: AC
  Administered 2016-10-13: 1 g via INTRAVENOUS
  Filled 2016-10-13: qty 10

## 2016-10-13 MED ORDER — ALBUTEROL SULFATE (2.5 MG/3ML) 0.083% IN NEBU: 2.5000 mg | INHALATION_SOLUTION | Freq: Four times a day (QID) | RESPIRATORY_TRACT | Status: DC | PRN

## 2016-10-13 MED ORDER — LOSARTAN POTASSIUM-HCTZ 100-25 MG PO TABS: 1.0000 | ORAL_TABLET | Freq: Every day | ORAL | Status: DC

## 2016-10-13 MED ORDER — ONDANSETRON HCL 4 MG/2ML IJ SOLN
4.0000 mg | Freq: Four times a day (QID) | INTRAMUSCULAR | Status: DC | PRN
  Administered 2016-10-13 – 2016-10-15 (×3): 4 mg via INTRAVENOUS
  Filled 2016-10-13 (×3): qty 2

## 2016-10-13 MED ORDER — HYDROCHLOROTHIAZIDE 25 MG PO TABS
25.0000 mg | ORAL_TABLET | Freq: Every day | ORAL | Status: DC
  Administered 2016-10-14 – 2016-10-17 (×4): 25 mg via ORAL
  Filled 2016-10-13 (×4): qty 1

## 2016-10-13 MED ORDER — PANTOPRAZOLE SODIUM 40 MG PO TBEC
40.0000 mg | DELAYED_RELEASE_TABLET | Freq: Every day | ORAL | Status: DC
Start: 2016-10-13 — End: 2016-10-17
  Administered 2016-10-14 – 2016-10-17 (×4): 40 mg via ORAL
  Filled 2016-10-13 (×4): qty 1

## 2016-10-13 MED ORDER — OXYCODONE HCL 5 MG PO TABS
10.0000 mg | ORAL_TABLET | ORAL | Status: DC | PRN
  Administered 2016-10-14 – 2016-10-17 (×14): 10 mg via ORAL
  Filled 2016-10-13 (×15): qty 2

## 2016-10-13 MED ORDER — METOPROLOL TARTRATE 25 MG PO TABS
25.0000 mg | ORAL_TABLET | Freq: Two times a day (BID) | ORAL | Status: DC
  Administered 2016-10-13 – 2016-10-17 (×7): 25 mg via ORAL
  Filled 2016-10-13 (×8): qty 1

## 2016-10-13 MED ORDER — MECLIZINE HCL 25 MG PO TABS
25.0000 mg | ORAL_TABLET | Freq: Once | ORAL | Status: AC
Start: 2016-10-13 — End: 2016-10-13
  Administered 2016-10-13: 25 mg via ORAL
  Filled 2016-10-13: qty 1

## 2016-10-13 MED ORDER — MELOXICAM 7.5 MG PO TABS: 7.5000 mg | ORAL_TABLET | Freq: Every day | ORAL | Status: DC | PRN

## 2016-10-13 MED ORDER — LOSARTAN POTASSIUM 50 MG PO TABS
100.0000 mg | ORAL_TABLET | Freq: Every day | ORAL | Status: DC
  Administered 2016-10-14 – 2016-10-17 (×4): 100 mg via ORAL
  Filled 2016-10-13 (×4): qty 2

## 2016-10-13 NOTE — Consult Note (Signed)
Yvonne Rich, Yvonne Rich 76 y.o., female [id number]     Chief Complaint: facial trauma  HPI: 76 yo wf fainted and fell onto her face from several steps up.  Brief LOC.  Small scalp lac.  Brought to ED for eval.  CT head shows non-displaced fx's of RIGHT frontozygomatic suture, anterior RIGHT zygomatic arch, fronto-temporal skull.  No orbital rim or orbital floor fx.  No trismus.  PMH: Past Medical History:  Diagnosis Date  . Allergy   . Asthma   . CKD (chronic kidney disease) stage 3, GFR 30-59 ml/min   . Glaucoma   . Hypertension   . Poliomyelitis   . Sciatica of right side     Surg Hx: Past Surgical History:  Procedure Laterality Date  . CHOLECYSTECTOMY  2002  . COSMETIC SURGERY  2012   eye lids  . ROTATOR CUFF REPAIR  1994    FHx:   Family History  Problem Relation Age of Onset  . Hypertension Father   . Vision loss Father    SocHx:  reports that she has never smoked. She has never used smokeless tobacco. She reports that she does not drink alcohol or use drugs.  ALLERGIES:  Allergies  Allergen Reactions  . Latex Rash  . Erythromycin Rash  . Sulfonamide Derivatives Rash    Medications Prior to Admission  Medication Sig Dispense Refill  . aspirin 81 MG tablet Take 81 mg by mouth daily.    . Cyanocobalamin (VITAMIN B-12 PO) Take 1 tablet by mouth daily.    Marland Kitchen loratadine (CLARITIN) 10 MG tablet Take 10 mg by mouth daily.    Marland Kitchen losartan-hydrochlorothiazide (HYZAAR) 100-25 MG tablet TAKE 1 TABLET BY MOUTH ONCE A DAY 90 tablet 3  . metoprolol tartrate (LOPRESSOR) 25 MG tablet Take 1 tablet (25 mg total) by mouth 2 (two) times daily. 180 tablet 3  . travoprost, benzalkonium, (TRAVATAN) 0.004 % ophthalmic solution Place 1 drop into both eyes daily.     . Vitamin D, Cholecalciferol, 1000 units CAPS Take 2,000 Units by mouth daily.     Marland Kitchen albuterol (PROAIR HFA) 108 (90 BASE) MCG/ACT inhaler Inhale 2 puffs into the lungs every 6 (six) hours as needed for wheezing or shortness of  breath. 1 Inhaler 1  . gabapentin (NEURONTIN) 300 MG capsule Take 1 capsule (300 mg total) by mouth 3 (three) times daily. (Patient not taking: Reported on 10/13/2016) 90 capsule 3  . meloxicam (MOBIC) 7.5 MG tablet Take 1 tablet (7.5 mg total) by mouth daily as needed for pain. (Patient not taking: Reported on 10/13/2016) 30 tablet 0    Results for orders placed or performed during the hospital encounter of 10/13/16 (from the past 48 hour(s))  Urinalysis, Routine w reflex microscopic     Status: Abnormal   Collection Time: 10/13/16  1:23 PM  Result Value Ref Range   Color, Urine YELLOW YELLOW   APPearance HAZY (A) CLEAR   Specific Gravity, Urine 1.017 1.005 - 1.030   pH 6.0 5.0 - 8.0   Glucose, UA NEGATIVE NEGATIVE mg/dL   Hgb urine dipstick MODERATE (A) NEGATIVE   Bilirubin Urine NEGATIVE NEGATIVE   Ketones, ur NEGATIVE NEGATIVE mg/dL   Protein, ur 30 (A) NEGATIVE mg/dL   Nitrite NEGATIVE NEGATIVE   Leukocytes, UA LARGE (A) NEGATIVE   RBC / HPF 6-30 0 - 5 RBC/hpf   WBC, UA TOO NUMEROUS TO COUNT 0 - 5 WBC/hpf   Bacteria, UA FEW (A) NONE SEEN   Squamous Epithelial /  LPF 6-30 (A) NONE SEEN  CBC with Differential/Platelet     Status: Abnormal   Collection Time: 10/13/16  1:30 PM  Result Value Ref Range   WBC 13.8 (H) 4.0 - 10.5 K/uL   RBC 4.41 3.87 - 5.11 MIL/uL   Hemoglobin 14.3 12.0 - 15.0 g/dL   HCT 09.2 94.1 - 65.0 %   MCV 92.1 78.0 - 100.0 fL   MCH 32.4 26.0 - 34.0 pg   MCHC 35.2 30.0 - 36.0 g/dL   RDW 81.3 65.3 - 11.3 %   Platelets 297 150 - 400 K/uL   Neutrophils Relative % 91 %   Neutro Abs 12.5 (H) 1.7 - 7.7 K/uL   Lymphocytes Relative 5 %   Lymphs Abs 0.7 0.7 - 4.0 K/uL   Monocytes Relative 4 %   Monocytes Absolute 0.5 0.1 - 1.0 K/uL   Eosinophils Relative 0 %   Eosinophils Absolute 0.0 0.0 - 0.7 K/uL   Basophils Relative 0 %   Basophils Absolute 0.0 0.0 - 0.1 K/uL  Comprehensive metabolic panel     Status: Abnormal   Collection Time: 10/13/16  1:30 PM  Result  Value Ref Range   Sodium 133 (L) 135 - 145 mmol/L   Potassium 3.7 3.5 - 5.1 mmol/L   Chloride 97 (L) 101 - 111 mmol/L   CO2 25 22 - 32 mmol/L   Glucose, Bld 127 (H) 65 - 99 mg/dL   BUN 13 6 - 20 mg/dL   Creatinine, Ser 9.49 (H) 0.44 - 1.00 mg/dL   Calcium 14.8 8.9 - 65.4 mg/dL   Total Protein 7.2 6.5 - 8.1 g/dL   Albumin 4.1 3.5 - 5.0 g/dL   AST 49 (H) 15 - 41 U/L   ALT 24 14 - 54 U/L   Alkaline Phosphatase 57 38 - 126 U/L   Total Bilirubin 1.0 0.3 - 1.2 mg/dL   GFR calc non Af Amer 45 (L) >60 mL/min   GFR calc Af Amer 52 (L) >60 mL/min    Comment: (NOTE) The eGFR has been calculated using the CKD EPI equation. This calculation has not been validated in all clinical situations. eGFR's persistently <60 mL/min signify possible Chronic Kidney Disease.    Anion gap 11 5 - 15   Dg Shoulder Right  Result Date: 10/13/2016 CLINICAL DATA:  Fall today with right shoulder injury. Initial encounter. EXAM: RIGHT SHOULDER - 2+ VIEW COMPARISON:  None. FINDINGS: Atypical patient positioning due to discomfort. Negative for acute fracture or dislocation. Surgical anchor on the proximal humerus. Osteopenia. IMPRESSION: No acute finding. Electronically Signed   By: Marnee Spring M.D.   On: 10/13/2016 14:50   Dg Ankle Complete Left  Result Date: 10/13/2016 CLINICAL DATA:  Fall at 7:30 a.m. today. Left ankle swelling. Medial bruising. EXAM: LEFT ANKLE COMPLETE - 3+ VIEW COMPARISON:  None. FINDINGS: An oblique fracture is present in the distal tibia. There is soft tissue swelling over the medial malleolus without the medial fracture. The ankle joint is located. A plantar calcaneal spur is noted. IMPRESSION: 1. Minimally displaced oblique fracture of the distal fibula. 2. Soft tissue swelling over the medial malleolus. Electronically Signed   By: Marin Roberts M.D.   On: 10/13/2016 14:51   Ct Head Wo Contrast  Result Date: 10/13/2016 CLINICAL DATA:  Syncope with fall striking the right side of the  head. EXAM: CT HEAD WITHOUT CONTRAST CT CERVICAL SPINE WITHOUT CONTRAST TECHNIQUE: Multidetector CT imaging of the head and cervical spine was performed  following the standard protocol without intravenous contrast. Multiplanar CT image reconstructions of the cervical spine were also generated. COMPARISON:  None. FINDINGS: CT HEAD FINDINGS Brain: Mild age related atrophy. Chronic appearing small vessel change of the pons in the cerebral hemispheric white matter. No sign of acute infarction, mass lesion, hemorrhage, hydrocephalus or extra-axial collection. Vascular: There is atherosclerotic calcification of the major vessels at the base of the brain. Skull: Suspicion of minimal nondisplaced skull fracture in the right posterior frontal region. Sinuses/Orbits: Small amount of fluid dependent in the right maxillary sinus. Nondisplaced fracture of the lateral wall right maxillary sinus. Nondisplaced fracture of the zygomatic arch on the right. Other: Right scalp swelling. CT CERVICAL SPINE FINDINGS Alignment: Normal Skull base and vertebrae: No fracture Soft tissues and spinal canal: Negative Disc levels:  Ordinary spondylosis.  No advanced pathology. Upper chest: Pleural and parenchymal scarring. Other: None IMPRESSION: Trauma to the right side of the head. Nondisplaced fracture of the right zygomatic arch and lateral wall of the right maxillary sinus. Nondisplaced fracture of the right frontal bone. No intracranial hemorrhage or injury. Atrophy and chronic small vessel change of the brain. No cervical spine injury.  Ordinary spondylosis. Electronically Signed   By: Paulina Fusi M.D.   On: 10/13/2016 14:17   Ct Cervical Spine Wo Contrast  Result Date: 10/13/2016 CLINICAL DATA:  Syncope with fall striking the right side of the head. EXAM: CT HEAD WITHOUT CONTRAST CT CERVICAL SPINE WITHOUT CONTRAST TECHNIQUE: Multidetector CT imaging of the head and cervical spine was performed following the standard protocol without  intravenous contrast. Multiplanar CT image reconstructions of the cervical spine were also generated. COMPARISON:  None. FINDINGS: CT HEAD FINDINGS Brain: Mild age related atrophy. Chronic appearing small vessel change of the pons in the cerebral hemispheric white matter. No sign of acute infarction, mass lesion, hemorrhage, hydrocephalus or extra-axial collection. Vascular: There is atherosclerotic calcification of the major vessels at the base of the brain. Skull: Suspicion of minimal nondisplaced skull fracture in the right posterior frontal region. Sinuses/Orbits: Small amount of fluid dependent in the right maxillary sinus. Nondisplaced fracture of the lateral wall right maxillary sinus. Nondisplaced fracture of the zygomatic arch on the right. Other: Right scalp swelling. CT CERVICAL SPINE FINDINGS Alignment: Normal Skull base and vertebrae: No fracture Soft tissues and spinal canal: Negative Disc levels:  Ordinary spondylosis.  No advanced pathology. Upper chest: Pleural and parenchymal scarring. Other: None IMPRESSION: Trauma to the right side of the head. Nondisplaced fracture of the right zygomatic arch and lateral wall of the right maxillary sinus. Nondisplaced fracture of the right frontal bone. No intracranial hemorrhage or injury. Atrophy and chronic small vessel change of the brain. No cervical spine injury.  Ordinary spondylosis. Electronically Signed   By: Paulina Fusi M.D.   On: 10/13/2016 14:17    ROS:HA, photophobia, N.  Blood pressure 138/61, pulse 74, temperature 98 F (36.7 C), temperature source Oral, resp. rate 16, height 5\' 9"  (1.753 m), weight 102.1 kg (225 lb), SpO2 96 %.  PHYSICAL EXAM: Overall appearance:  Mental status OK.  No facial swelling or step offs. No trismus. Head:  See above Eyes:  EOMI, vision subjectively intact OU.  No proptosis Ears:  Not examined Nose: intact externally.  Not examined internally Oral Cavity:  Teeth in good repair Oral  Pharynx/Hypopharynx/Larynx:  Not examined Neuro: grossly intact Neck:  No  Masses or nodes.  Studies Reviewed:  CT head    Assessment/Plan Non displaced fx's RIGHT temporo-frontal  skull, RIGHT f-z suture line, anterior RIGHT zygomatic arch.  No fx orbital rims, orbital floor, frontal or ethmoid sinuses. No repair required for any of these.   Plan:  Ice, elevation, no nose blowing x 2 weeks.  Probably does not need Ophth consult.  Regular diet.  Low spectrum antibiotic (Keflex, Amoxicillin, etc.) for 10 days. Recheck my office as needed.  Jodi Marble 11/13/2351, 6:29 PM

## 2016-10-13 NOTE — H&P (Signed)
[facility] Admission Note  Yvonne Rich [birth year]  [id number].    Requesting MD: Gilford Raid, MD Chief Complaint/Reason for Consult: fall  HPI:  76 year-old female who presents to [facility] via EMS after a fall in her basement. She was initially amnestic to the event and did not remember how she fell but she now remembers. She reports falling from the stairs in her basement onto the concrete floor after helping her dog out of a crawl space. She reports LOC for an unknown amount of time before going upstairs to wake up her husband for help. She is c/o right facial pain, headaches, left ankle pain, and left toe pain. She reports nausea and vomiting during CT. She is also endorsing photophobia  Mild epistaxis in ED. She denies shortness of breath, chest pain, palpitations, abdominal pain, dysuria, hematuria, loss of bowel or bladder function. She denies hematemesis. She takes ASA 63m at home and denies other blood thinning medications.  ROS: Review of Systems  Constitutional: Negative for chills and fever.  HENT: Positive for nosebleeds. Negative for ear pain.   Respiratory: Positive for hemoptysis. Negative for cough and shortness of breath.   Cardiovascular: Negative for chest pain and leg swelling.  Gastrointestinal: Positive for nausea and vomiting. Negative for abdominal pain and blood in stool.  Genitourinary: Negative for dysuria, frequency, hematuria and urgency.  Musculoskeletal: Positive for falls. Negative for neck pain.  Neurological: Negative for speech change. Focal weakness: .ROS.    Family History  Problem Relation Age of Onset  . Hypertension Father   . Vision loss Father     Past Medical History:  Diagnosis Date  . Allergy   . Asthma   . CKD (chronic kidney disease) stage 3, GFR 30-59 ml/min   . Glaucoma   . Hypertension   . Poliomyelitis   . Sciatica of right side     Past Surgical History:  Procedure Laterality Date  . CHOLECYSTECTOMY  2002  .  COSMETIC SURGERY  2012   eye lids  . ROTATOR CUFF REPAIR  1994    Social History:  reports that she has never smoked. She has never used smokeless tobacco. She reports that she does not drink alcohol or use drugs.  Allergies:  Allergies  Allergen Reactions  . Erythromycin     REACTION: rash  . Sulfonamide Derivatives     REACTION: rash     (Not in a hospital admission)  Blood pressure 194/66, pulse 73, temperature 98 F (36.7 C), temperature source Oral, resp. rate 14, height _0  (1.753 m), weight 102.1 kg (225 lb), SpO2 100 %. Physical Exam: General: pleasant, obese white female who is laying in bed in NAD HEENT: head with small ~2-2 cm right temporal scalp laceration - staple in place. Sclera are noninjected.  PERRL.  Ears and nose without any masses- scant blood present in nares bilaterally.  Mouth is pink and moist Heart: regular, rate, and rhythm.  No obvious murmurs, gallops, or rubs noted.  Palpable right pedal pulse, left ankle in splint. L toe tender to palpation. Capillary refill <2. Lungs: CTAB, no wheezes, rhonchi, or rales noted.  Respiratory effort nonlabored Abd: soft, NT/ND, +BS, no masses, hernias, or organomegaly MS: all 4 extremities are symmetrical with no cyanosis, clubbing, or edema. Right deltoid with small abrasion. Skin: warm and dry with no masses, lesions, or rashes Psych: A&Ox3 with an appropriate affect. Neuro: grossly intact normal speech  Results for orders placed or performed during the hospital encounter  of 10/13/16 (from the past 48 hour(s))  Urinalysis, Routine w reflex microscopic     Status: Abnormal   Collection Time: 10/13/16  1:23 PM  Result Value Ref Range   Color, Urine YELLOW YELLOW   APPearance HAZY (A) CLEAR   Specific Gravity, Urine 1.017 1.005 - 1.030   pH 6.0 5.0 - 8.0   Glucose, UA NEGATIVE NEGATIVE mg/dL   Hgb urine dipstick MODERATE (A) NEGATIVE   Bilirubin Urine NEGATIVE NEGATIVE   Ketones, ur NEGATIVE NEGATIVE mg/dL    Protein, ur 30 (A) NEGATIVE mg/dL   Nitrite NEGATIVE NEGATIVE   Leukocytes, UA LARGE (A) NEGATIVE   RBC / HPF 6-30 0 - 5 RBC/hpf   WBC, UA TOO NUMEROUS TO COUNT 0 - 5 WBC/hpf   Bacteria, UA FEW (A) NONE SEEN   Squamous Epithelial / LPF 6-30 (A) NONE SEEN  CBC with Differential/Platelet     Status: Abnormal   Collection Time: 10/13/16  1:30 PM  Result Value Ref Range   WBC 13.8 (H) 4.0 - 10.5 K/uL   RBC 4.41 3.87 - 5.11 MIL/uL   Hemoglobin 14.3 12.0 - 15.0 g/dL   HCT 40.6 36.0 - 46.0 %   MCV 92.1 78.0 - 100.0 fL   MCH 32.4 26.0 - 34.0 pg   MCHC 35.2 30.0 - 36.0 g/dL   RDW 12.7 11.5 - 15.5 %   Platelets 297 150 - 400 K/uL   Neutrophils Relative % 91 %   Neutro Abs 12.5 (H) 1.7 - 7.7 K/uL   Lymphocytes Relative 5 %   Lymphs Abs 0.7 0.7 - 4.0 K/uL   Monocytes Relative 4 %   Monocytes Absolute 0.5 0.1 - 1.0 K/uL   Eosinophils Relative 0 %   Eosinophils Absolute 0.0 0.0 - 0.7 K/uL   Basophils Relative 0 %   Basophils Absolute 0.0 0.0 - 0.1 K/uL  Comprehensive metabolic panel     Status: Abnormal   Collection Time: 10/13/16  1:30 PM  Result Value Ref Range   Sodium 133 (L) 135 - 145 mmol/L   Potassium 3.7 3.5 - 5.1 mmol/L   Chloride 97 (L) 101 - 111 mmol/L   CO2 25 22 - 32 mmol/L   Glucose, Bld 127 (H) 65 - 99 mg/dL   BUN 13 6 - 20 mg/dL   Creatinine, Ser 1.16 (H) 0.44 - 1.00 mg/dL   Calcium 10.3 8.9 - 10.3 mg/dL   Total Protein 7.2 6.5 - 8.1 g/dL   Albumin 4.1 3.5 - 5.0 g/dL   AST 49 (H) 15 - 41 U/L   ALT 24 14 - 54 U/L   Alkaline Phosphatase 57 38 - 126 U/L   Total Bilirubin 1.0 0.3 - 1.2 mg/dL   GFR calc non Af Amer 45 (L) >60 mL/min   GFR calc Af Amer 52 (L) >60 mL/min    Comment: (NOTE) The eGFR has been calculated using the CKD EPI equation. This calculation has not been validated in all clinical situations. eGFR's persistently <60 mL/min signify possible Chronic Kidney Disease.    Anion gap 11 5 - 15   Dg Shoulder Right  Result Date: 10/13/2016 CLINICAL DATA:   Fall today with right shoulder injury. Initial encounter. EXAM: RIGHT SHOULDER - 2+ VIEW COMPARISON:  None. FINDINGS: Atypical patient positioning due to discomfort. Negative for acute fracture or dislocation. Surgical anchor on the proximal humerus. Osteopenia. IMPRESSION: No acute finding. Electronically Signed   By: Monte Fantasia M.D.   On: 10/13/2016 14:50  Dg Ankle Complete Left  Result Date: 10/13/2016 CLINICAL DATA:  Fall at 7:30 a.m. today. Left ankle swelling. Medial bruising. EXAM: LEFT ANKLE COMPLETE - 3+ VIEW COMPARISON:  None. FINDINGS: An oblique fracture is present in the distal tibia. There is soft tissue swelling over the medial malleolus without the medial fracture. The ankle joint is located. A plantar calcaneal spur is noted. IMPRESSION: 1. Minimally displaced oblique fracture of the distal fibula. 2. Soft tissue swelling over the medial malleolus. Electronically Signed   By: San Morelle M.D.   On: 10/13/2016 14:51   Ct Head Wo Contrast  Result Date: 10/13/2016 CLINICAL DATA:  Syncope with fall striking the right side of the head. EXAM: CT HEAD WITHOUT CONTRAST CT CERVICAL SPINE WITHOUT CONTRAST TECHNIQUE: Multidetector CT imaging of the head and cervical spine was performed following the standard protocol without intravenous contrast. Multiplanar CT image reconstructions of the cervical spine were also generated. COMPARISON:  None. FINDINGS: CT HEAD FINDINGS Brain: Mild age related atrophy. Chronic appearing small vessel change of the pons in the cerebral hemispheric white matter. No sign of acute infarction, mass lesion, hemorrhage, hydrocephalus or extra-axial collection. Vascular: There is atherosclerotic calcification of the major vessels at the base of the brain. Skull: Suspicion of minimal nondisplaced skull fracture in the right posterior frontal region. Sinuses/Orbits: Small amount of fluid dependent in the right maxillary sinus. Nondisplaced fracture of the lateral  wall right maxillary sinus. Nondisplaced fracture of the zygomatic arch on the right. Other: Right scalp swelling. CT CERVICAL SPINE FINDINGS Alignment: Normal Skull base and vertebrae: No fracture Soft tissues and spinal canal: Negative Disc levels:  Ordinary spondylosis.  No advanced pathology. Upper chest: Pleural and parenchymal scarring. Other: None IMPRESSION: Trauma to the right side of the head. Nondisplaced fracture of the right zygomatic arch and lateral wall of the right maxillary sinus. Nondisplaced fracture of the right frontal bone. No intracranial hemorrhage or injury. Atrophy and chronic small vessel change of the brain. No cervical spine injury.  Ordinary spondylosis. Electronically Signed   By: Nelson Chimes M.D.   On: 10/13/2016 14:17   Ct Cervical Spine Wo Contrast  Result Date: 10/13/2016 CLINICAL DATA:  Syncope with fall striking the right side of the head. EXAM: CT HEAD WITHOUT CONTRAST CT CERVICAL SPINE WITHOUT CONTRAST TECHNIQUE: Multidetector CT imaging of the head and cervical spine was performed following the standard protocol without intravenous contrast. Multiplanar CT image reconstructions of the cervical spine were also generated. COMPARISON:  None. FINDINGS: CT HEAD FINDINGS Brain: Mild age related atrophy. Chronic appearing small vessel change of the pons in the cerebral hemispheric white matter. No sign of acute infarction, mass lesion, hemorrhage, hydrocephalus or extra-axial collection. Vascular: There is atherosclerotic calcification of the major vessels at the base of the brain. Skull: Suspicion of minimal nondisplaced skull fracture in the right posterior frontal region. Sinuses/Orbits: Small amount of fluid dependent in the right maxillary sinus. Nondisplaced fracture of the lateral wall right maxillary sinus. Nondisplaced fracture of the zygomatic arch on the right. Other: Right scalp swelling. CT CERVICAL SPINE FINDINGS Alignment: Normal Skull base and vertebrae: No  fracture Soft tissues and spinal canal: Negative Disc levels:  Ordinary spondylosis.  No advanced pathology. Upper chest: Pleural and parenchymal scarring. Other: None IMPRESSION: Trauma to the right side of the head. Nondisplaced fracture of the right zygomatic arch and lateral wall of the right maxillary sinus. Nondisplaced fracture of the right frontal bone. No intracranial hemorrhage or injury. Atrophy and chronic small  vessel change of the brain. No cervical spine injury.  Ordinary spondylosis. Electronically Signed   By: Nelson Chimes M.D.   On: 10/13/2016 14:17   Assessment/Plan Fall Concussion/TBI - PT/OT Right zygomatic arch fracture, non-displaced - consult ENT, Dr. Erik Obey Maxillary sinus fracture Right frontal bone fracture, non-displaced Fracture distal fibula, left -ortho surgery consult, Dr. Sharol Given. No acute surgical needs.  Left toe pain - left foot film pending Scalp laceration, right - one staple placed in ED  UTI - Rocephin in ED   HTN  HLD Asthma CKD, Stage 3   ID: Rocephin 10/14/15 VTE: Lovenox   Plan: admit to trauma service for observation, therapies, and pain control. ENT and ortho surgery consults.    Jill Alexanders, [facility] 10/13/2016, 4:08 PM Pager: [phone] Consults: [phone] Mon-Fri 7:00 am-4:30 pm Sat-Sun 7:00 am-11:30 am

## 2016-10-13 NOTE — Progress Notes (Signed)
Orthopedic Tech Progress Note Patient Details:  Yvonne Rich December 24, 1940 [id number]  Ortho Devices Type of Ortho Device: Stirrup splint Ortho Device/Splint Location: Applied Stirrup splint to left ankle/foot.  pt tolerated well. Family at bedside.  Ortho Device/Splint Interventions: Application   Kristopher Oppenheim 10/13/2016, 4:37 PM

## 2016-10-13 NOTE — Progress Notes (Signed)
Patient ID: Yvonne Rich, female   DOB: [birth year], 76 y.o.   MRN: [id number] Left foot x-ray fracture IP joint proximal phanylx great toe, post op shoe ordered, no surgery needed, continue NWB LLE.

## 2016-10-13 NOTE — ED Provider Notes (Signed)
[facility] DEPT Provider Note   CSN: [id number] Arrival date & time: 10/13/16  1204     History   Chief Complaint No chief complaint on file.   HPI Yvonne RISSLER is a 76 y.o. female.  HPI   77 year old female With history of hypertension, and chronic kidney disease brought here via EMS from home for evaluation of a recent fall. History obtained through patient and through daughter who is at bedside. Patient report this morning after cooking breakfast, she went down to the basement at her dog out after they were barking. She believes she may have fallen and had a single episode because the next thing that she remember was finding herself in her bedroom with blood on her scalp. She notified her husband who contact her daughter and patient was brought here for further evaluation. She reported memory lapse from the time she was in the basement and when she found herself in the bedroom.  Family member report seeing blood on the basement floor which they believe patient may have fallen backward and striking her head. This was an unwitnessed fall. Patient unable to recall any precipitating symptoms prior to the fall. She has been her normal self leading up to this event. She is currently complaining of a moderate tenderness to her right scalp, as well as pain to her right shoulder and her left ankle. Her tetanus is not up-to-date. Aside from taking baby aspirin she is not on any blood thinner medication. She currently denies any confusion, chest pain, shortness of breath, abdominal pain, back pain, focal numbness or weakness. She does report feeling dizzy with room spinning sensation with head movement. Dizziness seems to improve when she lay still. Does report ringing in her ears but this is chronic. Denies any recent medication change, denies alcohol use or any other environmental changes.    Past Medical History:  Diagnosis Date  . Allergy   . Asthma   . CKD (chronic kidney disease)  stage 3, GFR 30-59 ml/min   . Glaucoma   . Hypertension   . Poliomyelitis   . Sciatica of right side     Patient Active Problem List   Diagnosis Date Noted  . Sciatica of right side   . Allergy   . Asthma   . Glaucoma   . OBESITY 09/17/2007  . POLIOMYELITIS 05/24/2007  . HYPERLIPIDEMIA 05/24/2007  . HYPERTENSION 05/24/2007    Past Surgical History:  Procedure Laterality Date  . CHOLECYSTECTOMY  2002  . COSMETIC SURGERY  2012   eye lids  . ROTATOR CUFF REPAIR  1994    OB History    No data available       Home Medications    Prior to Admission medications   Medication Sig Start Date End Date Taking? Authorizing Provider  albuterol (PROAIR HFA) 108 (90 BASE) MCG/ACT inhaler Inhale 2 puffs into the lungs every 6 (six) hours as needed for wheezing or shortness of breath. 10/05/15   Susy Frizzle, MD  aspirin 81 MG tablet Take 81 mg by mouth daily.    Historical Provider, MD  Esomeprazole Magnesium (NEXIUM PO) Take 22.5 mg by mouth daily.    Historical Provider, MD  gabapentin (NEURONTIN) 300 MG capsule Take 1 capsule (300 mg total) by mouth 3 (three) times daily. Patient not taking: Reported on 10/05/2015 06/28/15   Susy Frizzle, MD  loratadine (CLARITIN) 10 MG tablet Take 10 mg by mouth daily.    Historical Provider, MD  losartan-hydrochlorothiazide (HYZAAR) 100-25 MG tablet TAKE 1 TABLET BY MOUTH ONCE A DAY 07/19/16   Susy Frizzle, MD  meloxicam (MOBIC) 7.5 MG tablet Take 1 tablet (7.5 mg total) by mouth daily as needed for pain. 10/05/15   Susy Frizzle, MD  metoprolol tartrate (LOPRESSOR) 25 MG tablet Take 1 tablet (25 mg total) by mouth 2 (two) times daily. 10/05/15   Susy Frizzle, MD  Probiotic Product (PROBIOTIC DAILY PO) Take by mouth.    Historical Provider, MD  travoprost, benzalkonium, (TRAVATAN) 0.004 % ophthalmic solution Apply to eye.    Historical Provider, MD  Vitamin D, Cholecalciferol, 400 UNITS TABS Take by mouth.    Historical Provider,  MD  vitamin E 400 UNIT capsule Take 400 Units by mouth daily.    Historical Provider, MD    Family History Family History  Problem Relation Age of Onset  . Hypertension Father   . Vision loss Father     Social History Social History  Substance Use Topics  . Smoking status: Never Smoker  . Smokeless tobacco: Never Used  . Alcohol use No     Allergies   Erythromycin and Sulfonamide derivatives   Review of Systems Review of Systems  All other systems reviewed and are negative.    Physical Exam Updated Vital Signs BP 194/66   Pulse 73   Temp 98 F (36.7 C) (Oral)   Resp 14   Ht 5\' 9"  (1.753 m)   Wt 102.1 kg   SpO2 100%   BMI 33.23 kg/m   Physical Exam  Constitutional: She appears well-developed and well-nourished. No distress.  Patient laying in bed in no acute discomfort.  HENT:  Head: Atraumatic.  Small laceration noted to right temporal region with surrounding dried blood, tenderness to palpation without crepitus.  No hemotympanum, no septal hematoma, no malocclusion, no midface tenderness, no battle sign.  Eyes: Conjunctivae and EOM are normal. Pupils are equal, round, and reactive to light.  Neck: Normal range of motion. Neck supple.  No significant cervical midline spine tenderness crepitus or step-off  Cardiovascular: Normal rate and regular rhythm.   Pulmonary/Chest: Effort normal and breath sounds normal.  Abdominal: Soft. She exhibits no distension. There is no tenderness.  Musculoskeletal: She exhibits tenderness (Right shoulder: Faint abrasion noted to lateral deltoid with tenderness to shoulder but no deformity. Sensation is intact, range of motion is intact.).  Left ankle: Contusion noted to the medial aspects of ankle with surrounding edema and tenderness to palpation but no crepitus. Normal dorsiflexion and plantar flexion.  No significant midline spine tenderness, crepitus, or step-off  Neurological: She is alert. She has normal strength. No  cranial nerve deficit or sensory deficit. GCS eye subscore is 4. GCS verbal subscore is 5. GCS motor subscore is 6.  Skin: No rash noted.  Psychiatric: She has a normal mood and affect.  Nursing note and vitals reviewed.    ED Treatments / Results  Labs (all labs ordered are listed, but only abnormal results are displayed) Labs Reviewed  URINALYSIS, ROUTINE W REFLEX MICROSCOPIC - Abnormal; Notable for the following:       Result Value   APPearance HAZY (*)    Hgb urine dipstick MODERATE (*)    Protein, ur 30 (*)    Leukocytes, UA LARGE (*)    Bacteria, UA FEW (*)    Squamous Epithelial / LPF 6-30 (*)    All other components within normal limits  CBC WITH DIFFERENTIAL/PLATELET - Abnormal; Notable for  the following:    WBC 13.8 (*)    Neutro Abs 12.5 (*)    All other components within normal limits  COMPREHENSIVE METABOLIC PANEL - Abnormal; Notable for the following:    Sodium 133 (*)    Chloride 97 (*)    Glucose, Bld 127 (*)    Creatinine, Ser 1.16 (*)    AST 49 (*)    GFR calc non Af Amer 45 (*)    GFR calc Af Amer 52 (*)    All other components within normal limits  CBC WITH DIFFERENTIAL/PLATELET  CBG MONITORING, ED    EKG  EKG Interpretation  Date/Time:  Friday October 13 2016 12:05:40 EST Ventricular Rate:  71 PR Interval:    QRS Duration: 84 QT Interval:  410 QTC Calculation: 446 R Axis:   42 Text Interpretation:  Sinus rhythm RSR' in V1 or V2, right VCD or RVH Repol abnrm suggests ischemia, lateral leads Confirmed by [facility] MD, JULIE ([id number]) on 10/13/2016 2:14:18 PM       Radiology Dg Shoulder Right  Result Date: 10/13/2016 CLINICAL DATA:  Fall today with right shoulder injury. Initial encounter. EXAM: RIGHT SHOULDER - 2+ VIEW COMPARISON:  None. FINDINGS: Atypical patient positioning due to discomfort. Negative for acute fracture or dislocation. Surgical anchor on the proximal humerus. Osteopenia. IMPRESSION: No acute finding. Electronically Signed   By:  Monte Fantasia M.D.   On: 10/13/2016 14:50   Dg Ankle Complete Left  Result Date: 10/13/2016 CLINICAL DATA:  Fall at 7:30 a.m. today. Left ankle swelling. Medial bruising. EXAM: LEFT ANKLE COMPLETE - 3+ VIEW COMPARISON:  None. FINDINGS: An oblique fracture is present in the distal tibia. There is soft tissue swelling over the medial malleolus without the medial fracture. The ankle joint is located. A plantar calcaneal spur is noted. IMPRESSION: 1. Minimally displaced oblique fracture of the distal fibula. 2. Soft tissue swelling over the medial malleolus. Electronically Signed   By: San Morelle M.D.   On: 10/13/2016 14:51   Ct Head Wo Contrast  Result Date: 10/13/2016 CLINICAL DATA:  Syncope with fall striking the right side of the head. EXAM: CT HEAD WITHOUT CONTRAST CT CERVICAL SPINE WITHOUT CONTRAST TECHNIQUE: Multidetector CT imaging of the head and cervical spine was performed following the standard protocol without intravenous contrast. Multiplanar CT image reconstructions of the cervical spine were also generated. COMPARISON:  None. FINDINGS: CT HEAD FINDINGS Brain: Mild age related atrophy. Chronic appearing small vessel change of the pons in the cerebral hemispheric white matter. No sign of acute infarction, mass lesion, hemorrhage, hydrocephalus or extra-axial collection. Vascular: There is atherosclerotic calcification of the major vessels at the base of the brain. Skull: Suspicion of minimal nondisplaced skull fracture in the right posterior frontal region. Sinuses/Orbits: Small amount of fluid dependent in the right maxillary sinus. Nondisplaced fracture of the lateral wall right maxillary sinus. Nondisplaced fracture of the zygomatic arch on the right. Other: Right scalp swelling. CT CERVICAL SPINE FINDINGS Alignment: Normal Skull base and vertebrae: No fracture Soft tissues and spinal canal: Negative Disc levels:  Ordinary spondylosis.  No advanced pathology. Upper chest: Pleural and  parenchymal scarring. Other: None IMPRESSION: Trauma to the right side of the head. Nondisplaced fracture of the right zygomatic arch and lateral wall of the right maxillary sinus. Nondisplaced fracture of the right frontal bone. No intracranial hemorrhage or injury. Atrophy and chronic small vessel change of the brain. No cervical spine injury.  Ordinary spondylosis. Electronically Signed   By: Elta Guadeloupe  Shogry M.D.   On: 10/13/2016 14:17   Ct Cervical Spine Wo Contrast  Result Date: 10/13/2016 CLINICAL DATA:  Syncope with fall striking the right side of the head. EXAM: CT HEAD WITHOUT CONTRAST CT CERVICAL SPINE WITHOUT CONTRAST TECHNIQUE: Multidetector CT imaging of the head and cervical spine was performed following the standard protocol without intravenous contrast. Multiplanar CT image reconstructions of the cervical spine were also generated. COMPARISON:  None. FINDINGS: CT HEAD FINDINGS Brain: Mild age related atrophy. Chronic appearing small vessel change of the pons in the cerebral hemispheric white matter. No sign of acute infarction, mass lesion, hemorrhage, hydrocephalus or extra-axial collection. Vascular: There is atherosclerotic calcification of the major vessels at the base of the brain. Skull: Suspicion of minimal nondisplaced skull fracture in the right posterior frontal region. Sinuses/Orbits: Small amount of fluid dependent in the right maxillary sinus. Nondisplaced fracture of the lateral wall right maxillary sinus. Nondisplaced fracture of the zygomatic arch on the right. Other: Right scalp swelling. CT CERVICAL SPINE FINDINGS Alignment: Normal Skull base and vertebrae: No fracture Soft tissues and spinal canal: Negative Disc levels:  Ordinary spondylosis.  No advanced pathology. Upper chest: Pleural and parenchymal scarring. Other: None IMPRESSION: Trauma to the right side of the head. Nondisplaced fracture of the right zygomatic arch and lateral wall of the right maxillary sinus. Nondisplaced  fracture of the right frontal bone. No intracranial hemorrhage or injury. Atrophy and chronic small vessel change of the brain. No cervical spine injury.  Ordinary spondylosis. Electronically Signed   By: Nelson Chimes M.D.   On: 10/13/2016 14:17    Procedures Procedures (including critical care time)  LACERATION REPAIR Performed by: Domenic Moras Authorized by: Domenic Moras Consent: Verbal consent obtained. Risks and benefits: risks, benefits and alternatives were discussed Consent given by: patient Patient identity confirmed: provided demographic data Prepped and Draped in normal sterile fashion Wound explored  Laceration Location: R temporal region  Laceration Length: 1cm  No Foreign Bodies seen or palpated  Anesthesia: local infiltration  Local anesthetic: lidocaine 2% w epinephrine  Anesthetic total: 1 ml  Irrigation method: syringe Amount of cleaning: standard  Skin closure: surgical staple  Number of sutures: 1  Technique: surgical staple  Patient tolerance: Patient tolerated the procedure well with no immediate complications.   Medications Ordered in ED Medications  cefTRIAXone (ROCEPHIN) 1 g in dextrose 5 % 50 mL IVPB (1 g Intravenous New Bag/Given 10/13/16 1544)  morphine 4 MG/ML injection 6 mg (not administered)  ondansetron (ZOFRAN) 4 MG/2ML injection (4 mg  Given 10/13/16 1235)  meclizine (ANTIVERT) tablet 25 mg (25 mg Oral Given 10/13/16 1341)  morphine 4 MG/ML injection 4 mg (4 mg Intravenous Given 10/13/16 1341)  ondansetron (ZOFRAN) injection 4 mg (4 mg Intravenous Given 10/13/16 1341)  Tdap (BOOSTRIX) injection 0.5 mL (0.5 mLs Intramuscular Given 10/13/16 1341)  lidocaine-EPINEPHrine (XYLOCAINE W/EPI) 2 %-1:200000 (PF) injection 10 mL (10 mLs Intradermal Given 10/13/16 1539)     Initial Impression / Assessment and Plan / ED Course  I have reviewed the triage vital signs and the nursing notes.  Pertinent labs & imaging results that were available during my care of  the patient were reviewed by me and considered in my medical decision making (see chart for details).  Clinical Course     BP 194/66   Pulse 73   Temp 98 F (36.7 C) (Oral)   Resp 14   Ht 5\' 9"  (1.753 m)   Wt 102.1 kg   SpO2 100%  BMI 33.23 kg/m    Final Clinical Impressions(s) / ED Diagnoses   Final diagnoses:  Open non-depressed fracture of skull, initial encounter ([place])  Concussion with loss of consciousness of 30 minutes or less, initial encounter  Other closed fracture of distal end of left fibula, initial encounter  Acute lower UTI  Scalp laceration, initial encounter  Contusion of right shoulder, initial encounter    New Prescriptions New Prescriptions   No medications on file   1:21 PM This is a 76 year old female who had an apparent fall that was unwitnessed, injuring her scalp in the process and had a syncopal episode with transient loss of memory. She was unable to recall what has happened initially however after staying in the ER for approximately 2 hours, she now remembers the event. She recalled trying to pick up a dog on the stair case when she lost balance and fell and injured her head. She is now back to normal baseline although she does complain of dizziness with positional change. No focal neuro deficit on exam. Does have  nystagmus. She suffered a small laceration to her right temporal region. Scalp laceration was repaired.  Head CT scan demonstrate nondisplaced fracture of the right zygomatic arch and lateral wall of the right maxillary sinus. Nofracture of the right frontal bone. No intracranial hemorrhage or injury. No cervical spine injury.  Left ankle x-ray demonstrating a minimally displaced oblique fractures of the distal fibula with associate soft tissue swelling. Her distal sensation is intact. Will apply a stirrup ankle splint and will consult orthopedic.  Care discussed with Dr. Gilford Raid   3:56 PM Appreciate consultation from trauma surgeon Dr.  Grandville Silos who agrees to see and admit pt for further care. Pt is aware of plan.      Domenic Moras, PA-C 10/13/16 1611    Isla Pence, MD 10/13/16 [phone]

## 2016-10-13 NOTE — Consult Note (Signed)
ORTHOPAEDIC CONSULTATION  REQUESTING PHYSICIAN: Trauma Md, MD  Chief Complaint: Left ankle left great toe pain, right shoulder pain  HPI: Yvonne Rich is a 76 y.o. female who presents with a ground-level fall sustaining a nondisplaced Weber B fibular fracture left ankle bruising and pain left great toe and right shoulder pain as well as facial fractures.  Past Medical History:  Diagnosis Date  . Allergy   . Asthma   . CKD (chronic kidney disease) stage 3, GFR 30-59 ml/min   . Glaucoma   . Hypertension   . Poliomyelitis   . Sciatica of right side    Past Surgical History:  Procedure Laterality Date  . CHOLECYSTECTOMY  2002  . COSMETIC SURGERY  2012   eye lids  . ROTATOR CUFF REPAIR  1994   Social History   Social History  . Marital status: Married    Spouse name: N/A  . Number of children: N/A  . Years of education: N/A   Social History Main Topics  . Smoking status: Never Smoker  . Smokeless tobacco: Never Used  . Alcohol use No  . Drug use: No  . Sexual activity: Yes   Other Topics Concern  . None   Social History Narrative  . None   Family History  Problem Relation Age of Onset  . Hypertension Father   . Vision loss Father    - negative except otherwise stated in the family history section Allergies  Allergen Reactions  . Erythromycin     REACTION: rash  . Sulfonamide Derivatives     REACTION: rash   Prior to Admission medications   Medication Sig Start Date End Date Taking? Authorizing Provider  albuterol (PROAIR HFA) 108 (90 BASE) MCG/ACT inhaler Inhale 2 puffs into the lungs every 6 (six) hours as needed for wheezing or shortness of breath. 10/05/15   Susy Frizzle, MD  aspirin 81 MG tablet Take 81 mg by mouth daily.    Historical Provider, MD  Esomeprazole Magnesium (NEXIUM PO) Take 22.5 mg by mouth daily.    Historical Provider, MD  gabapentin (NEURONTIN) 300 MG capsule Take 1 capsule (300 mg total) by mouth 3 (three) times  daily. Patient not taking: Reported on 10/05/2015 06/28/15   Susy Frizzle, MD  loratadine (CLARITIN) 10 MG tablet Take 10 mg by mouth daily.    Historical Provider, MD  losartan-hydrochlorothiazide (HYZAAR) 100-25 MG tablet TAKE 1 TABLET BY MOUTH ONCE A DAY 07/19/16   Susy Frizzle, MD  meloxicam (MOBIC) 7.5 MG tablet Take 1 tablet (7.5 mg total) by mouth daily as needed for pain. 10/05/15   Susy Frizzle, MD  metoprolol tartrate (LOPRESSOR) 25 MG tablet Take 1 tablet (25 mg total) by mouth 2 (two) times daily. 10/05/15   Susy Frizzle, MD  Probiotic Product (PROBIOTIC DAILY PO) Take by mouth.    Historical Provider, MD  travoprost, benzalkonium, (TRAVATAN) 0.004 % ophthalmic solution Apply to eye.    Historical Provider, MD  Vitamin D, Cholecalciferol, 400 UNITS TABS Take by mouth.    Historical Provider, MD  vitamin E 400 UNIT capsule Take 400 Units by mouth daily.    Historical Provider, MD   Dg Shoulder Right  Result Date: 10/13/2016 CLINICAL DATA:  Fall today with right shoulder injury. Initial encounter. EXAM: RIGHT SHOULDER - 2+ VIEW COMPARISON:  None. FINDINGS: Atypical patient positioning due to discomfort. Negative for acute fracture or dislocation. Surgical anchor on the proximal humerus. Osteopenia. IMPRESSION: No acute  finding. Electronically Signed   By: Monte Fantasia M.D.   On: 10/13/2016 14:50   Dg Ankle Complete Left  Result Date: 10/13/2016 CLINICAL DATA:  Fall at 7:30 a.m. today. Left ankle swelling. Medial bruising. EXAM: LEFT ANKLE COMPLETE - 3+ VIEW COMPARISON:  None. FINDINGS: An oblique fracture is present in the distal tibia. There is soft tissue swelling over the medial malleolus without the medial fracture. The ankle joint is located. A plantar calcaneal spur is noted. IMPRESSION: 1. Minimally displaced oblique fracture of the distal fibula. 2. Soft tissue swelling over the medial malleolus. Electronically Signed   By: San Morelle M.D.   On: 10/13/2016  14:51   Ct Head Wo Contrast  Result Date: 10/13/2016 CLINICAL DATA:  Syncope with fall striking the right side of the head. EXAM: CT HEAD WITHOUT CONTRAST CT CERVICAL SPINE WITHOUT CONTRAST TECHNIQUE: Multidetector CT imaging of the head and cervical spine was performed following the standard protocol without intravenous contrast. Multiplanar CT image reconstructions of the cervical spine were also generated. COMPARISON:  None. FINDINGS: CT HEAD FINDINGS Brain: Mild age related atrophy. Chronic appearing small vessel change of the pons in the cerebral hemispheric white matter. No sign of acute infarction, mass lesion, hemorrhage, hydrocephalus or extra-axial collection. Vascular: There is atherosclerotic calcification of the major vessels at the base of the brain. Skull: Suspicion of minimal nondisplaced skull fracture in the right posterior frontal region. Sinuses/Orbits: Small amount of fluid dependent in the right maxillary sinus. Nondisplaced fracture of the lateral wall right maxillary sinus. Nondisplaced fracture of the zygomatic arch on the right. Other: Right scalp swelling. CT CERVICAL SPINE FINDINGS Alignment: Normal Skull base and vertebrae: No fracture Soft tissues and spinal canal: Negative Disc levels:  Ordinary spondylosis.  No advanced pathology. Upper chest: Pleural and parenchymal scarring. Other: None IMPRESSION: Trauma to the right side of the head. Nondisplaced fracture of the right zygomatic arch and lateral wall of the right maxillary sinus. Nondisplaced fracture of the right frontal bone. No intracranial hemorrhage or injury. Atrophy and chronic small vessel change of the brain. No cervical spine injury.  Ordinary spondylosis. Electronically Signed   By: Nelson Chimes M.D.   On: 10/13/2016 14:17   Ct Cervical Spine Wo Contrast  Result Date: 10/13/2016 CLINICAL DATA:  Syncope with fall striking the right side of the head. EXAM: CT HEAD WITHOUT CONTRAST CT CERVICAL SPINE WITHOUT CONTRAST  TECHNIQUE: Multidetector CT imaging of the head and cervical spine was performed following the standard protocol without intravenous contrast. Multiplanar CT image reconstructions of the cervical spine were also generated. COMPARISON:  None. FINDINGS: CT HEAD FINDINGS Brain: Mild age related atrophy. Chronic appearing small vessel change of the pons in the cerebral hemispheric white matter. No sign of acute infarction, mass lesion, hemorrhage, hydrocephalus or extra-axial collection. Vascular: There is atherosclerotic calcification of the major vessels at the base of the brain. Skull: Suspicion of minimal nondisplaced skull fracture in the right posterior frontal region. Sinuses/Orbits: Small amount of fluid dependent in the right maxillary sinus. Nondisplaced fracture of the lateral wall right maxillary sinus. Nondisplaced fracture of the zygomatic arch on the right. Other: Right scalp swelling. CT CERVICAL SPINE FINDINGS Alignment: Normal Skull base and vertebrae: No fracture Soft tissues and spinal canal: Negative Disc levels:  Ordinary spondylosis.  No advanced pathology. Upper chest: Pleural and parenchymal scarring. Other: None IMPRESSION: Trauma to the right side of the head. Nondisplaced fracture of the right zygomatic arch and lateral wall of the right maxillary  sinus. Nondisplaced fracture of the right frontal bone. No intracranial hemorrhage or injury. Atrophy and chronic small vessel change of the brain. No cervical spine injury.  Ordinary spondylosis. Electronically Signed   By: Nelson Chimes M.D.   On: 10/13/2016 14:17   - pertinent xrays, CT, MRI studies were reviewed and independently interpreted  Positive ROS: All other systems have been reviewed and were otherwise negative with the exception of those mentioned in the HPI and as above.  Physical Exam: General: Alert, no acute distress Psychiatric: Patient is competent for consent with normal mood and affect Lymphatic: No axillary or cervical  lymphadenopathy Cardiovascular: No pedal edema Respiratory: No cyanosis, no use of accessory musculature GI: No organomegaly, abdomen is soft and non-tender  Skin: Examination the skin is intact in the left lower extremity there is no lacerations no open fracture.   Neurologic: Patient does have protective sensation bilateral lower extremities.   MUSCULOSKELETAL:  Semination patient is a good dorsalis pedis pulse bilaterally. Her great toes have similar alignment there is ecchymosis and bruising around the left great toe and the toe is tender to palpation the metatarsals are nontender to palpation. Patient is in a sugar tong splint at this time. Radiographs shows a nondisplaced Weber B left fibular fracture with a congruent mortise there is no displacement. Patient has right shoulder pain however radiographs shows no evidence of a fracture.  Assessment: Assessment: Nondisplaced Weber B left fibular fracture with left great toe pain and right shoulder pain.  Plan: Plan: Plan for conservative treatment for the left ankle fracture. If patient develops displacement could proceed with open reduction internal fixation however at this time feel patient would benefit from closed treatment. We will obtain radiographs of the left foot. Patient may advance nonweightbearing left lower extremity she may require skilled nursing placement.  Thank you for the consult and the opportunity to see Ms. Socorro, MD [facility] [phone] 4:55 PM

## 2016-10-14 LAB — BASIC METABOLIC PANEL
Anion gap: 9 (ref 5–15)
BUN: 13 mg/dL (ref 6–20)
CHLORIDE: 98 mmol/L — AB (ref 101–111)
CO2: 24 mmol/L (ref 22–32)
Calcium: 9.4 mg/dL (ref 8.9–10.3)
Creatinine, Ser: 1.13 mg/dL — ABNORMAL HIGH (ref 0.44–1.00)
GFR calc non Af Amer: 46 mL/min — ABNORMAL LOW (ref >60)
GFR, EST AFRICAN AMERICAN: 54 mL/min — AB (ref >60)
Glucose, Bld: 125 mg/dL — ABNORMAL HIGH (ref 65–99)
POTASSIUM: 4 mmol/L (ref 3.5–5.1)
SODIUM: 131 mmol/L — AB (ref 135–145)

## 2016-10-14 LAB — CBC
HCT: 37.5 % (ref 36.0–46.0)
HEMOGLOBIN: 12.9 g/dL (ref 12.0–15.0)
MCH: 32.3 pg (ref 26.0–34.0)
MCHC: 34.4 g/dL (ref 30.0–36.0)
MCV: 94 fL (ref 78.0–100.0)
Platelets: 263 10*3/uL (ref 150–400)
RBC: 3.99 MIL/uL (ref 3.87–5.11)
RDW: 13.1 % (ref 11.5–15.5)
WBC: 8.8 10*3/uL (ref 4.0–10.5)

## 2016-10-14 MED ORDER — PROMETHAZINE HCL 25 MG/ML IJ SOLN: 12.5000 mg | INTRAMUSCULAR | Status: DC | PRN

## 2016-10-14 MED ORDER — PROMETHAZINE HCL 25 MG PO TABS
12.5000 mg | ORAL_TABLET | ORAL | Status: DC | PRN
Start: 2016-10-14 — End: 2016-10-17
  Administered 2016-10-14 – 2016-10-17 (×4): 12.5 mg via ORAL
  Filled 2016-10-14 (×4): qty 1

## 2016-10-14 NOTE — Progress Notes (Signed)
PT Cancellation Note  Patient Details Name: Yvonne Rich MRN: [id number] DOB: Feb 25, 1941   Cancelled Treatment:    Reason Eval/Treat Not Completed: Medical issues which prohibited therapy. Per Md, pt has sustained a concussion with fall and is not medically ready for therapy today. Will recheck tomorrow to see if pt is more stable.    Scheryl Marten PT, DPT  [phone]  10/14/2016, 11:43 AM

## 2016-10-14 NOTE — Progress Notes (Signed)
Patient ID: Yvonne Rich, female   DOB: 1941-02-27, 76 y.o.   MRN: [id number] Patient complains of headaches and nausea with any type of attempted movement from her concussion. Radiographs do show a fracture of the great toe as well as nondisplaced ankle fracture. Patient may progress with physical therapy nonweightbearing on the left anticipate discharge to skilled nursing.

## 2016-10-14 NOTE — Progress Notes (Signed)
Subjective: Patient is quite nauseated after being moved around in x-ray Wants the room quiet and dark Alert and oriented Complaining of pain in right ear/ left ankle  No surgical repair needed for facial fractures/ left ankle fracture  Objective: Vital signs in last 24 hours: Temp:  [97.3 F (36.3 C)-98.5 F (36.9 C)] 97.3 F (36.3 C) (01/06 0500) Pulse Rate:  [56-76] 66 (01/06 0500) Resp:  [13-18] 18 (01/06 0500) BP: (124-194)/(51-66) 146/51 (01/06 0500) SpO2:  [95 %-100 %] 98 % (01/06 0500) Weight:  [102.1 kg (225 lb)] 102.1 kg (225 lb) (01/05 1214)    Intake/Output from previous day: 01/05 0701 - 01/06 0700 In: 50 [IV Piggyback:50] Out: -  Intake/Output this shift: Total I/O In: 120 [P.O.:120] Out: -   WDWN in NAD HEENT:  - right temporal scalp lac - stapled Right ear with some contusion; tenderness CV - RRR; no murmurs Lungs - CTA B; no chest tenderness Abd - soft, non-tender; no masses Right shoulder - abrasion; tender Left ankle in splint Skin - warm and dry Lab Results:   Recent Labs  10/13/16 1330 10/14/16 0626  WBC 13.8* 8.8  HGB 14.3 12.9  HCT 40.6 37.5  PLT 297 263   BMET  Recent Labs  10/13/16 1330 10/14/16 0626  NA 133* 131*  K 3.7 4.0  CL 97* 98*  CO2 25 24  GLUCOSE 127* 125*  BUN 13 13  CREATININE 1.16* 1.13*  CALCIUM 10.3 9.4   PT/INR No results for input(s): LABPROT, INR in the last 72 hours. ABG No results for input(s): PHART, HCO3 in the last 72 hours.  Invalid input(s): PCO2, PO2  Studies/Results: Dg Shoulder Right  Result Date: 10/13/2016 CLINICAL DATA:  Fall today with right shoulder injury. Initial encounter. EXAM: RIGHT SHOULDER - 2+ VIEW COMPARISON:  None. FINDINGS: Atypical patient positioning due to discomfort. Negative for acute fracture or dislocation. Surgical anchor on the proximal humerus. Osteopenia. IMPRESSION: No acute finding. Electronically Signed   By: Monte Fantasia M.D.   On: 10/13/2016 14:50    Dg Ankle Complete Left  Result Date: 10/13/2016 CLINICAL DATA:  Fall at 7:30 a.m. today. Left ankle swelling. Medial bruising. EXAM: LEFT ANKLE COMPLETE - 3+ VIEW COMPARISON:  None. FINDINGS: An oblique fracture is present in the distal tibia. There is soft tissue swelling over the medial malleolus without the medial fracture. The ankle joint is located. A plantar calcaneal spur is noted. IMPRESSION: 1. Minimally displaced oblique fracture of the distal fibula. 2. Soft tissue swelling over the medial malleolus. Electronically Signed   By: San Morelle M.D.   On: 10/13/2016 14:51   Ct Head Wo Contrast  Result Date: 10/13/2016 CLINICAL DATA:  Syncope with fall striking the right side of the head. EXAM: CT HEAD WITHOUT CONTRAST CT CERVICAL SPINE WITHOUT CONTRAST TECHNIQUE: Multidetector CT imaging of the head and cervical spine was performed following the standard protocol without intravenous contrast. Multiplanar CT image reconstructions of the cervical spine were also generated. COMPARISON:  None. FINDINGS: CT HEAD FINDINGS Brain: Mild age related atrophy. Chronic appearing small vessel change of the pons in the cerebral hemispheric white matter. No sign of acute infarction, mass lesion, hemorrhage, hydrocephalus or extra-axial collection. Vascular: There is atherosclerotic calcification of the major vessels at the base of the brain. Skull: Suspicion of minimal nondisplaced skull fracture in the right posterior frontal region. Sinuses/Orbits: Small amount of fluid dependent in the right maxillary sinus. Nondisplaced fracture of the lateral wall right maxillary sinus. Nondisplaced  fracture of the zygomatic arch on the right. Other: Right scalp swelling. CT CERVICAL SPINE FINDINGS Alignment: Normal Skull base and vertebrae: No fracture Soft tissues and spinal canal: Negative Disc levels:  Ordinary spondylosis.  No advanced pathology. Upper chest: Pleural and parenchymal scarring. Other: None IMPRESSION:  Trauma to the right side of the head. Nondisplaced fracture of the right zygomatic arch and lateral wall of the right maxillary sinus. Nondisplaced fracture of the right frontal bone. No intracranial hemorrhage or injury. Atrophy and chronic small vessel change of the brain. No cervical spine injury.  Ordinary spondylosis. Electronically Signed   By: Nelson Chimes M.D.   On: 10/13/2016 14:17   Ct Cervical Spine Wo Contrast  Result Date: 10/13/2016 CLINICAL DATA:  Syncope with fall striking the right side of the head. EXAM: CT HEAD WITHOUT CONTRAST CT CERVICAL SPINE WITHOUT CONTRAST TECHNIQUE: Multidetector CT imaging of the head and cervical spine was performed following the standard protocol without intravenous contrast. Multiplanar CT image reconstructions of the cervical spine were also generated. COMPARISON:  None. FINDINGS: CT HEAD FINDINGS Brain: Mild age related atrophy. Chronic appearing small vessel change of the pons in the cerebral hemispheric white matter. No sign of acute infarction, mass lesion, hemorrhage, hydrocephalus or extra-axial collection. Vascular: There is atherosclerotic calcification of the major vessels at the base of the brain. Skull: Suspicion of minimal nondisplaced skull fracture in the right posterior frontal region. Sinuses/Orbits: Small amount of fluid dependent in the right maxillary sinus. Nondisplaced fracture of the lateral wall right maxillary sinus. Nondisplaced fracture of the zygomatic arch on the right. Other: Right scalp swelling. CT CERVICAL SPINE FINDINGS Alignment: Normal Skull base and vertebrae: No fracture Soft tissues and spinal canal: Negative Disc levels:  Ordinary spondylosis.  No advanced pathology. Upper chest: Pleural and parenchymal scarring. Other: None IMPRESSION: Trauma to the right side of the head. Nondisplaced fracture of the right zygomatic arch and lateral wall of the right maxillary sinus. Nondisplaced fracture of the right frontal bone. No  intracranial hemorrhage or injury. Atrophy and chronic small vessel change of the brain. No cervical spine injury.  Ordinary spondylosis. Electronically Signed   By: Nelson Chimes M.D.   On: 10/13/2016 14:17   Dg Foot Complete Left  Result Date: 10/13/2016 CLINICAL DATA:  Right great toe pain since a fall this morning the patient is tall but not for over. Initial encounter. EXAM: LEFT FOOT - COMPLETE 3+ VIEW COMPARISON:  None. FINDINGS: The patient has a fracture with the intra-articular extension through the head of the proximal phalanx of the great toe. The fracture is minimally displaced. Distal fibular fracture is noted with the patient now in a fiberglass splint. IMPRESSION: Mildly displaced intra-articular fracture head of the proximal phalanx of left great toe. Electronically Signed   By: Inge Rise M.D.   On: 10/13/2016 18:35    Anti-infectives: Anti-infectives    Start     Dose/Rate Route Frequency Ordered Stop   10/13/16 1545  cefTRIAXone (ROCEPHIN) 1 g in dextrose 5 % 50 mL IVPB     1 g 100 mL/hr over 30 Minutes Intravenous  Once 10/13/16 1533 10/13/16 1649      Assessment/Plan: Fall Concussion/TBI - PT/OT - will probably not be able to participate much today because of nausea; reevaluate Sunday Right zygomatic arch fracture, non-displaced - no surgery needed per [facility] Maxillary sinus fracture Right frontal bone fracture, non-displaced Fracture distal fibula, left -ortho surgery consult, Dr. Sharol Given. No acute surgical needs.  Left toe  pain - left foot film  - mildly displaced fx head proximal phalanx left great toe Scalp laceration, right - one staple placed in ED  UTI - Rocephin in ED   HTN  HLD Asthma CKD, Stage 3   ID: Rocephin 10/14/15 VTE: Lovenox   Plan:  Minimal stimulation today for concussion/ nausea Will try PT/ OT eval tomorrow Clear liquids until nausea improved No surgical treatment needed for facial/ left ankle fractures  LOS: 1 day     Yvonne Gladney K. 10/14/2016

## 2016-10-14 NOTE — Progress Notes (Signed)
OT Cancellation Note  Patient Details Name: Yvonne Rich MRN: [id number] DOB: 06/15/1941   Cancelled Treatment:    Reason Eval/Treat Not Completed: Medical issues which prohibited therapy. Per PT note: Per Md, pt has sustained a concussion with fall and is not medically ready for therapy today. Will recheck tomorrow to see if pt is more stable.   Tyrone Schimke OTR/L Pager: [phone]   10/14/2016, 3:58 PM

## 2016-10-14 NOTE — Progress Notes (Signed)
Orthopedic Tech Progress Note Patient Details:  Yvonne Rich 03/31/1941 [id number]  Ortho Devices Type of Ortho Device: Postop shoe/boot Ortho Device/Splint Location: lle Ortho Device/Splint Interventions: Application   Weylin Plagge 10/14/2016, 8:46 AM

## 2016-10-15 ENCOUNTER — Inpatient Hospital Stay (HOSPITAL_COMMUNITY): Payer: Medicare HMO

## 2016-10-15 NOTE — Evaluation (Signed)
Occupational Therapy Evaluation Patient Details Name: Yvonne Rich MRN: [id number] DOB: 1940/10/29 Today's Date: 10/15/2016    History of Present Illness Pt is a 76 yo female admitted on 10/13/16 following a fall resulting in multiple facial fx, L ankle and toe Fx, and significant concussion. PMH significant for Allergies, Asthma, HTN, CKD III, RTC left shoulder 1994, poliomyelitis.     Clinical Impression   PTA, pt was independent with all ADL and IADL, was driving and provides assistance with IADL to her husband daily. Pt experienced a fall when letting her dogs out of her basement and sustained multiple facial fractures, a concussion, and L ankle and toe fractures. See PT note for further details concerning situation of fall. Pt currently requires max assist with LB ADL and min assist with UB ABL. Pt additionally limited by dizziness that becomes worse with head movements. Pt able to complete bed mobility with gradual transitions to ease dizziness. She was able to sit at EOB for grooming tasks and able to complete sit<>stand with min assist +2 for safety. Pt would benefit from continued OT services while admitted to improve independence with ADL and functional mobility. Pt is a good candidate for short-term SNF placement for continued rehabilitation in order to maximize independence with ADL and return to PLOF. OT will continue to follow acutely.    Follow Up Recommendations  SNF;Supervision/Assistance - 24 hour    Equipment Recommendations  Other (comment) (TBD at next venue of care)       Precautions / Restrictions Precautions Precautions: Fall Precaution Comments: Due to symptoms from concussion  Required Braces or Orthoses: Other Brace/Splint (Post-op shoe) Restrictions Weight Bearing Restrictions: Yes LLE Weight Bearing: Non weight bearing      Mobility Bed Mobility Overal bed mobility: Needs Assistance Bed Mobility: Supine to Sit     Supine to sit: Min guard;HOB  elevated     General bed mobility comments: Min guard for safety, pt requires increased time to move EOB due to dizzness. Move HOB to max positiong before starting to move.   Transfers Overall transfer level: Needs assistance Equipment used: Rolling walker (2 wheeled) Transfers: Sit to/from Stand Sit to Stand: Min assist;+2 safety/equipment         General transfer comment: Min A from EOB. Pt is able to maintain NWB on LLE initially, but as she fatigues has to sit down.     Balance Overall balance assessment: History of Falls                                          ADL Overall ADL's : Needs assistance/impaired     Grooming: Brushing hair;Minimal assistance;Sitting Grooming Details (indicate cue type and reason): Min assist for back of head Upper Body Bathing: Minimal assistance;Sitting   Lower Body Bathing: Maximal assistance;Sit to/from stand   Upper Body Dressing : Minimal assistance;Sitting   Lower Body Dressing: Maximal assistance;Sit to/from stand       Toileting- Water quality scientist and Hygiene: Maximal assistance;Sit to/from stand         General ADL Comments: Pt educated on compensatory strategies to help with symptoms of concussion such as dimming lights and avoiding loud noises. Pt able to complete sit<>stand this session but unable to hop for simulated toilet transfer.     Vision Vision Assessment?: No apparent visual deficits   Perception     Praxis  Pertinent Vitals/Pain Pain Assessment: Faces Faces Pain Scale: Hurts a little bit Pain Location: Left ankle Pain Descriptors / Indicators: Guarding Pain Intervention(s): Monitored during session;Premedicated before session     Hand Dominance Right   Extremity/Trunk Assessment Upper Extremity Assessment Upper Extremity Assessment: LUE deficits/detail LUE Deficits / Details: Decreased AROM L shoulder at baseline (previous rotator cuff repair) but worse since fall due to  landing on shoulder. Limited to approximately 100 degrees. Significant bruising on IV digit limiting hand strength due to pain.   Lower Extremity Assessment Lower Extremity Assessment: Defer to PT evaluation LLE Deficits / Details: Overall 3/5 ankle and hip per gross functional assessment. Unable to assess ankle.  LLE: Unable to fully assess due to immobilization   Cervical / Trunk Assessment Cervical / Trunk Assessment: Normal   Communication Communication Communication: No difficulties;HOH   Cognition Arousal/Alertness: Awake/alert Behavior During Therapy: WFL for tasks assessed/performed Overall Cognitive Status: Within Functional Limits for tasks assessed                 General Comments: no significant cognitive deficits noted. pt able to recall events that occurred before fall this session.    General Comments       Exercises       Shoulder Instructions      Home Living Family/patient expects to be discharged to:: Private residence Living Arrangements: Spouse/significant other Available Help at Discharge: Family;Available PRN/intermittently Type of Home: House Home Access: Stairs to enter;Ramped entrance Entrance Stairs-Number of Steps: 2 Entrance Stairs-Rails: Right Home Layout: One level     Bathroom Shower/Tub: Occupational psychologist: Handicapped height     Home Equipment: None          Prior Functioning/Environment Level of Independence: Independent        Comments: pt was complete independent, cooking and cleaning and driving herself and husband.  Husband has memory issues.         OT Problem List: Decreased strength;Decreased range of motion;Decreased activity tolerance;Impaired balance (sitting and/or standing);Decreased safety awareness;Decreased knowledge of use of DME or AE;Decreased knowledge of precautions;Pain   OT Treatment/Interventions: Self-care/ADL training;Therapeutic exercise;Therapeutic activities;Patient/family  education;Balance training    OT Goals(Current goals can be found in the care plan section) Acute Rehab OT Goals Patient Stated Goal: to get back to feeling better OT Goal Formulation: With patient/family Time For Goal Achievement: 10/29/16 Potential to Achieve Goals: Good ADL Goals Pt Will Perform Upper Body Bathing: with modified independence;sitting Pt Will Perform Lower Body Bathing: with modified independence;sit to/from stand Pt Will Perform Upper Body Dressing: with modified independence;sitting Pt Will Perform Lower Body Dressing: with modified independence;with adaptive equipment;sit to/from stand Pt Will Transfer to Toilet: with modified independence;ambulating;bedside commode Pt Will Perform Toileting - Clothing Manipulation and hygiene: with modified independence;sit to/from stand Pt Will Perform Tub/Shower Transfer: with modified independence;3 in 1;rolling walker;ambulating Additional ADL Goal #1: Pt will verbalize 3 fall prevention strategies independently in order to improve safety during ADL participation.  OT Frequency: Min 2X/week   Barriers to D/C:            Co-evaluation PT/OT/SLP Co-Evaluation/Treatment: Yes Reason for Co-Treatment: Complexity of the patient's impairments (multi-system involvement);For patient/therapist safety PT goals addressed during session: Mobility/safety with mobility;Balance OT goals addressed during session: ADL's and self-care      End of Session Equipment Utilized During Treatment: Gait belt;Rolling walker Nurse Communication: Other (comment) (Pt back in bed and ready for transport to x-ray when needed)  Activity Tolerance: Patient tolerated  treatment well Patient left: in bed;with call bell/phone within reach;with family/visitor present   Time: [id number] OT Time Calculation (min): 41 min Charges:  OT General Charges $OT Visit: 1 Procedure OT Evaluation $OT Eval Moderate Complexity: 1 Procedure  Pernie Grosso A Vong Garringer  [phone] 10/15/2016, 2:16 PM

## 2016-10-15 NOTE — Progress Notes (Signed)
[facility] Progress Note     Subjective: Sensitivity to light and nausea improved compared to yesterday but still present. Phenergan greatly improved nausea. Nausea is the worst when patient moves her head or rolls over. No vomiting today. Has a mildy increased appetite compared to yesterday. C/o R ankle pain and ortho ordered right foot film.  PT/OT about to attempt to work with patient, RN to pre-medicate with anti-emetic.  Objective: Vital signs in last 24 hours: Temp:  [97.4 F (36.3 C)-98.5 F (36.9 C)] 97.6 F (36.4 C) (01/07 0459) Pulse Rate:  [52-76] 61 (01/07 0800) Resp:  [16-18] 16 (01/07 0150) BP: (139-153)/(55-75) 153/60 (01/07 0800) SpO2:  [97 %-100 %] 100 % (01/07 0800) Last BM Date: 10/13/16  Intake/Output from previous day: 01/06 0701 - 01/07 0700 In: 1390 [P.O.:840; I.V.:550] Out: -  Intake/Output this shift: No intake/output data recorded.  PE: Gen:  Alert, NAD, pleasant HEENT: R scalp lac with one staple in place, right ear tenderness/contusion Card:  RRR, no M/G/R apprecaited Pulm:  CTAB, unlabored Abd: Soft, NT/ND, +BS Ext:  Left ankle in splint, toes warm.   Lab Results:   Recent Labs  10/13/16 1330 10/14/16 0626  WBC 13.8* 8.8  HGB 14.3 12.9  HCT 40.6 37.5  PLT 297 263   BMET  Recent Labs  10/13/16 1330 10/14/16 0626  NA 133* 131*  K 3.7 4.0  CL 97* 98*  CO2 25 24  GLUCOSE 127* 125*  BUN 13 13  CREATININE 1.16* 1.13*  CALCIUM 10.3 9.4   PT/INR No results for input(s): LABPROT, INR in the last 72 hours. CMP     Component Value Date/Time   NA 131 (L) 10/14/2016 0626   NA 134 (L) 08/21/2012 1413   K 4.0 10/14/2016 0626   K 4.2 08/21/2012 1413   CL 98 (L) 10/14/2016 0626   CL 100 08/21/2012 1413   CO2 24 10/14/2016 0626   CO2 32 08/21/2012 1413   GLUCOSE 125 (H) 10/14/2016 0626   GLUCOSE 96 08/21/2012 1413   BUN 13 10/14/2016 0626   BUN 16 08/21/2012 1413   CREATININE 1.13 (H) 10/14/2016 0626   CREATININE  1.14 (H) 10/01/2015 0809   CALCIUM 9.4 10/14/2016 0626   CALCIUM 9.5 08/21/2012 1413   PROT 7.2 10/13/2016 1330   ALBUMIN 4.1 10/13/2016 1330   AST 49 (H) 10/13/2016 1330   ALT 24 10/13/2016 1330   ALKPHOS 57 10/13/2016 1330   BILITOT 1.0 10/13/2016 1330   GFRNONAA 46 (L) 10/14/2016 0626   GFRNONAA 47 (L) 10/01/2015 0809   GFRAA 54 (L) 10/14/2016 0626   GFRAA 55 (L) 10/01/2015 0809   Lipase  No results found for: LIPASE     Studies/Results: Dg Shoulder Right  Result Date: 10/13/2016 CLINICAL DATA:  Fall today with right shoulder injury. Initial encounter. EXAM: RIGHT SHOULDER - 2+ VIEW COMPARISON:  None. FINDINGS: Atypical patient positioning due to discomfort. Negative for acute fracture or dislocation. Surgical anchor on the proximal humerus. Osteopenia. IMPRESSION: No acute finding. Electronically Signed   By: Monte Fantasia M.D.   On: 10/13/2016 14:50   Dg Ankle Complete Left  Result Date: 10/13/2016 CLINICAL DATA:  Fall at 7:30 a.m. today. Left ankle swelling. Medial bruising. EXAM: LEFT ANKLE COMPLETE - 3+ VIEW COMPARISON:  None. FINDINGS: An oblique fracture is present in the distal tibia. There is soft tissue swelling over the medial malleolus without the medial fracture. The ankle joint is located. A plantar calcaneal spur is noted.  IMPRESSION: 1. Minimally displaced oblique fracture of the distal fibula. 2. Soft tissue swelling over the medial malleolus. Electronically Signed   By: San Morelle M.D.   On: 10/13/2016 14:51   Ct Head Wo Contrast  Result Date: 10/13/2016 CLINICAL DATA:  Syncope with fall striking the right side of the head. EXAM: CT HEAD WITHOUT CONTRAST CT CERVICAL SPINE WITHOUT CONTRAST TECHNIQUE: Multidetector CT imaging of the head and cervical spine was performed following the standard protocol without intravenous contrast. Multiplanar CT image reconstructions of the cervical spine were also generated. COMPARISON:  None. FINDINGS: CT HEAD FINDINGS  Brain: Mild age related atrophy. Chronic appearing small vessel change of the pons in the cerebral hemispheric white matter. No sign of acute infarction, mass lesion, hemorrhage, hydrocephalus or extra-axial collection. Vascular: There is atherosclerotic calcification of the major vessels at the base of the brain. Skull: Suspicion of minimal nondisplaced skull fracture in the right posterior frontal region. Sinuses/Orbits: Small amount of fluid dependent in the right maxillary sinus. Nondisplaced fracture of the lateral wall right maxillary sinus. Nondisplaced fracture of the zygomatic arch on the right. Other: Right scalp swelling. CT CERVICAL SPINE FINDINGS Alignment: Normal Skull base and vertebrae: No fracture Soft tissues and spinal canal: Negative Disc levels:  Ordinary spondylosis.  No advanced pathology. Upper chest: Pleural and parenchymal scarring. Other: None IMPRESSION: Trauma to the right side of the head. Nondisplaced fracture of the right zygomatic arch and lateral wall of the right maxillary sinus. Nondisplaced fracture of the right frontal bone. No intracranial hemorrhage or injury. Atrophy and chronic small vessel change of the brain. No cervical spine injury.  Ordinary spondylosis. Electronically Signed   By: Nelson Chimes M.D.   On: 10/13/2016 14:17   Ct Cervical Spine Wo Contrast  Result Date: 10/13/2016 CLINICAL DATA:  Syncope with fall striking the right side of the head. EXAM: CT HEAD WITHOUT CONTRAST CT CERVICAL SPINE WITHOUT CONTRAST TECHNIQUE: Multidetector CT imaging of the head and cervical spine was performed following the standard protocol without intravenous contrast. Multiplanar CT image reconstructions of the cervical spine were also generated. COMPARISON:  None. FINDINGS: CT HEAD FINDINGS Brain: Mild age related atrophy. Chronic appearing small vessel change of the pons in the cerebral hemispheric white matter. No sign of acute infarction, mass lesion, hemorrhage, hydrocephalus  or extra-axial collection. Vascular: There is atherosclerotic calcification of the major vessels at the base of the brain. Skull: Suspicion of minimal nondisplaced skull fracture in the right posterior frontal region. Sinuses/Orbits: Small amount of fluid dependent in the right maxillary sinus. Nondisplaced fracture of the lateral wall right maxillary sinus. Nondisplaced fracture of the zygomatic arch on the right. Other: Right scalp swelling. CT CERVICAL SPINE FINDINGS Alignment: Normal Skull base and vertebrae: No fracture Soft tissues and spinal canal: Negative Disc levels:  Ordinary spondylosis.  No advanced pathology. Upper chest: Pleural and parenchymal scarring. Other: None IMPRESSION: Trauma to the right side of the head. Nondisplaced fracture of the right zygomatic arch and lateral wall of the right maxillary sinus. Nondisplaced fracture of the right frontal bone. No intracranial hemorrhage or injury. Atrophy and chronic small vessel change of the brain. No cervical spine injury.  Ordinary spondylosis. Electronically Signed   By: Nelson Chimes M.D.   On: 10/13/2016 14:17   Dg Foot Complete Left  Result Date: 10/13/2016 CLINICAL DATA:  Right great toe pain since a fall this morning the patient is tall but not for over. Initial encounter. EXAM: LEFT FOOT - COMPLETE 3+ VIEW  COMPARISON:  None. FINDINGS: The patient has a fracture with the intra-articular extension through the head of the proximal phalanx of the great toe. The fracture is minimally displaced. Distal fibular fracture is noted with the patient now in a fiberglass splint. IMPRESSION: Mildly displaced intra-articular fracture head of the proximal phalanx of left great toe. Electronically Signed   By: Inge Rise M.D.   On: 10/13/2016 18:35    Anti-infectives: Anti-infectives    Start     Dose/Rate Route Frequency Ordered Stop   10/13/16 1545  cefTRIAXone (ROCEPHIN) 1 g in dextrose 5 % 50 mL IVPB     1 g 100 mL/hr over 30 Minutes  Intravenous  Once 10/13/16 1533 10/13/16 1649     Assessment/Plan Fall Concussion/TBI - PT/OT - will probably not be able to participate much today because of nausea; reevaluate Sunday Right zygomatic arch fracture, non-displaced - no surgery needed per [facility] Maxillary sinus fracture Right frontal bone fracture, non-displaced Fracture distal fibula, left -ortho surgery consult, Dr. Sharol Given. No acute surgical needs.  Left toe pain - left foot film  - mildly displaced fx head proximal phalanx left great toe Scalp laceration, right - one staple placed in ED  UTI - Rocephin in ED   HTN  HLD Asthma CKD, Stage 3   ID: Rocephin 10/14/15 VTE: Lovenox   Plan:  R ankle film PT/OT Advance diet to full liquids at dinner if nausea still controlled    LOS: 2 days    Jill Alexanders , [facility] 10/15/2016, 11:08 AM Pager: [phone] Consults: [phone] Mon-Fri 7:00 am-4:30 pm Sat-Sun 7:00 am-11:30 am

## 2016-10-15 NOTE — Evaluation (Signed)
Physical Therapy Evaluation Patient Details Name: Yvonne Rich MRN: [id number] DOB: 09/14/1941 Today's Date: 10/15/2016   History of Present Illness  Pt is a 76 yo female admitted on 10/13/16 following a fall resulting in multiple facial fx, L ankle and toe Fx, and significant concussion. PMH significant for Allergies, Asthma, HTN, CKD III, RTC left shoulder 1994, poliomyelitis.    Clinical Impression  Pt presents with the above diagnosis following a fall on 10/13/16. Pt was going to let her dogs out of the basement when she fell, what her family thinks, was 4' from the stairs landing on her right side and fracturing multiple bones in her face, sustaining a concussion, and fracturing her left ankle and toe. No surgical interventions were completed. Prior to admission, pt lived with her husband in a single level home and was completely independent. Pt suffered a significant concussion and continues to have symptoms of dizziness with head movements. Pt does well when moving slowly with transitional movements. Pt was able to sit EOB and stand briefly with PT and OT present. Pt will benefit from continuing to be seen acutely to address below deficits before advancing to next venue of care. Pt will benefit from SNF placement for short term rehab as her husband is not able to care for her and family is only available PRN.     Follow Up Recommendations SNF;Supervision/Assistance - 24 hour    Equipment Recommendations  None recommended by PT;Other (comment) (defer to next venue of care)    Recommendations for Other Services       Precautions / Restrictions Precautions Precautions: Fall Precaution Comments: Due to symptoms from concussion  Restrictions Weight Bearing Restrictions: Yes LLE Weight Bearing: Non weight bearing      Mobility  Bed Mobility Overal bed mobility: Needs Assistance Bed Mobility: Supine to Sit     Supine to sit: Min guard;HOB elevated     General bed mobility  comments: Min guard for safety, pt requires increased time to move EOB due to dizzness. Move HOB to max positiong before starting to move.   Transfers Overall transfer level: Needs assistance Equipment used: Rolling walker (2 wheeled) Transfers: Sit to/from Stand Sit to Stand: Min assist;+2 safety/equipment         General transfer comment: Min A from EOB. Pt is able to maintain NWB on LLE initially, but as she fatigues has to sit down.   Ambulation/Gait                Stairs            Wheelchair Mobility    Modified Rankin (Stroke Patients Only)       Balance Overall balance assessment: History of Falls                                           Pertinent Vitals/Pain Pain Assessment: Faces Faces Pain Scale: Hurts a little bit Pain Location: Left ankle Pain Descriptors / Indicators: Guarding Pain Intervention(s): Monitored during session;Premedicated before session    Home Living Family/patient expects to be discharged to:: Private residence Living Arrangements: Spouse/significant other Available Help at Discharge: Family;Available PRN/intermittently Type of Home: House Home Access: Stairs to enter;Ramped entrance Entrance Stairs-Rails: Right Entrance Stairs-Number of Steps: 2 Home Layout: One level Home Equipment: None      Prior Function Level of Independence: Independent  Comments: pt was complete independent, cooking and cleaning and driving herself and husband.  Husband has memory issues.      Hand Dominance   Dominant Hand: Right    Extremity/Trunk Assessment   Upper Extremity Assessment Upper Extremity Assessment: Defer to OT evaluation    Lower Extremity Assessment Lower Extremity Assessment: LLE deficits/detail LLE Deficits / Details: Overall 3/5 ankle and hip per gross functional assessment. Unable to assess ankle.  LLE: Unable to fully assess due to immobilization    Cervical / Trunk  Assessment Cervical / Trunk Assessment: Normal  Communication   Communication: No difficulties;HOH  Cognition Arousal/Alertness: Awake/alert Behavior During Therapy: WFL for tasks assessed/performed Overall Cognitive Status: Within Functional Limits for tasks assessed                 General Comments: no significant cognitive deficits noted. pt able to recall events that occurred before fall this session.     General Comments      Exercises     Assessment/Plan    PT Assessment Patient needs continued PT services  PT Problem List Decreased strength;Decreased activity tolerance;Decreased balance;Decreased mobility;Decreased knowledge of use of DME;Pain          PT Treatment Interventions DME instruction;Functional mobility training;Stair training;Gait training;Therapeutic activities;Therapeutic exercise;Balance training;Patient/family education    PT Goals (Current goals can be found in the Care Plan section)  Acute Rehab PT Goals Patient Stated Goal: to get back to feeling better PT Goal Formulation: With patient Time For Goal Achievement: 10/22/16 Potential to Achieve Goals: Good    Frequency Min 3X/week   Barriers to discharge Decreased caregiver support Husband is unable to care for her and family is only available PRN    Co-evaluation PT/OT/SLP Co-Evaluation/Treatment: Yes Reason for Co-Treatment: Complexity of the patient's impairments (multi-system involvement);For patient/therapist safety;To address functional/ADL transfers PT goals addressed during session: Mobility/safety with mobility;Balance         End of Session Equipment Utilized During Treatment: Gait belt Activity Tolerance: Patient limited by fatigue;Other (comment) (dizziness ) Patient left: in bed;with call bell/phone within reach;with family/visitor present;Other (comment) (to be transported to Xray, did not attempt OOB) Nurse Communication: Mobility status         Time: [id number] PT  Time Calculation (min) (ACUTE ONLY): 41 min   Charges:   PT Evaluation $PT Eval Moderate Complexity: 1 Procedure PT Treatments $Therapeutic Activity: 8-22 mins   PT G Codes:        Scheryl Marten PT, DPT  [phone]  10/15/2016, 1:03 PM

## 2016-10-15 NOTE — Progress Notes (Signed)
Patient ID: Yvonne Rich, female   DOB: [birth year], 76 y.o.   MRN: [id number] Patient unable to dissipate with physical therapy due to her concussion. Patient is resting comfortably this morning. Anticipate discharge to skilled nursing.

## 2016-10-16 ENCOUNTER — Encounter (HOSPITAL_COMMUNITY): Payer: Self-pay | Admitting: *Deleted

## 2016-10-16 NOTE — NC FL2 (Signed)
[place] LEVEL OF CARE SCREENING TOOL     IDENTIFICATION  Patient Name: Yvonne Rich Birthdate: 07-Jul-1941 Sex: female Admission Date (Current Location): 10/13/2016  [facility] and [place] Number:  Herbalist and Address:  [facility] [place], [place] [address], [place], [place] [address]      Provider Number: [id number]  Attending Physician Name and Address:  Trauma Md, MD  Relative Name and Phone Number:  Adelfa Koh [phone] Daughter [phone]    Current Level of Care: Hospital Recommended Level of Care: [place] Prior Approval Number:    Date Approved/Denied:   PASRR Number: [id number]  Discharge Plan: SNF    Current Diagnoses: Patient Active Problem List   Diagnosis Date Noted  . Closed fracture of left ankle 10/13/2016  . Sciatica of right side   . Allergy   . Asthma   . Glaucoma   . OBESITY 09/17/2007  . POLIOMYELITIS 05/24/2007  . HYPERLIPIDEMIA 05/24/2007  . HYPERTENSION 05/24/2007    Orientation RESPIRATION BLADDER Height & Weight     Self, Time, Situation, Place  Normal Continent Weight: 225 lb (102.1 kg) Height:  5\' 9"  (175.3 cm)  BEHAVIORAL SYMPTOMS/MOOD NEUROLOGICAL BOWEL NUTRITION STATUS      Continent    AMBULATORY STATUS COMMUNICATION OF NEEDS Skin   Limited Assist Verbally Normal                       Personal Care Assistance Level of Assistance  Bathing, Dressing Bathing Assistance: Limited assistance   Dressing Assistance: Limited assistance     Functional Limitations Info             SPECIAL CARE FACTORS FREQUENCY  PT (By licensed PT), OT (By licensed OT)     PT Frequency: 5x wk OT Frequency: 5x wk            Contractures Contractures Info: Not present    Additional Factors Info  Code Status, Allergies Code Status Info: Full Allergies Info: Latex, Erythromycin, Sulfonamide Derivatives           Current Medications (10/16/2016):  This is the  current hospital active medication list Current Facility-Administered Medications  Medication Dose Route Frequency Provider Last Rate Last Dose  . acetaminophen (TYLENOL) tablet 650 mg  650 mg Oral Q4H PRN Georganna Skeans, MD   650 mg at 10/16/16 0139  . albuterol (PROVENTIL) (2.5 MG/3ML) 0.083% nebulizer solution 2.5 mg  2.5 mg Inhalation Q6H PRN Georganna Skeans, MD      . dextrose 5 % and 0.45 % NaCl with KCl 20 mEq/L infusion   Intravenous Continuous Jill Alexanders, PA-C 10 mL/hr at 10/16/16 0500    . enoxaparin (LOVENOX) injection 40 mg  40 mg Subcutaneous Q24H Georganna Skeans, MD   40 mg at 10/16/16 0944  . losartan (COZAAR) tablet 100 mg  100 mg Oral Daily Georganna Skeans, MD   100 mg at 10/16/16 0946   And  . hydrochlorothiazide (HYDRODIURIL) tablet 25 mg  25 mg Oral Daily Georganna Skeans, MD   25 mg at 10/16/16 0946  . loratadine (CLARITIN) tablet 10 mg  10 mg Oral Daily Georganna Skeans, MD   10 mg at 10/16/16 0946  . metoprolol tartrate (LOPRESSOR) tablet 25 mg  25 mg Oral BID Georganna Skeans, MD   25 mg at 10/16/16 0946  . morphine 2 MG/ML injection 2-4 mg  2-4 mg Intravenous Q2H PRN Georganna Skeans,  MD   4 mg at 10/14/16 0710  . ondansetron (ZOFRAN) tablet 4 mg  4 mg Oral Q6H PRN Georganna Skeans, MD   4 mg at 10/14/16 0858   Or  . ondansetron [facility]) injection 4 mg  4 mg Intravenous Q6H PRN Georganna Skeans, MD   4 mg at 10/15/16 1134  . oxyCODONE (Oxy IR/ROXICODONE) immediate release tablet 10 mg  10 mg Oral Q4H PRN Georganna Skeans, MD   10 mg at 10/16/16 0945  . oxyCODONE (Oxy IR/ROXICODONE) immediate release tablet 5 mg  5 mg Oral Q4H PRN Georganna Skeans, MD   5 mg at 10/14/16 1801  . pantoprazole (PROTONIX) EC tablet 40 mg  40 mg Oral Daily Georganna Skeans, MD   40 mg at 10/16/16 0946  . promethazine (PHENERGAN) tablet 12.5 mg  12.5 mg Oral Q4H PRN Donnie Mesa, MD   12.5 mg at 10/14/16 2153   Or  . promethazine (PHENERGAN) injection 12.5 mg  12.5 mg Intravenous Q4H PRN Donnie Mesa, MD          Discharge Medications: Please see discharge summary for a list of discharge medications.  Relevant Imaging Results:  Relevant Lab Results:   Additional Information    Erskin Zinda B, LCSWA

## 2016-10-16 NOTE — Care Management Note (Signed)
Case Management Note  Patient Details  Name: Yvonne Rich MRN: [id number] Date of Birth: 06/25/1941  Subjective/Objective:   Pt admitted on 10/13/16 s/p fall with multiple facial fx, Lt ankle and toe fx, and significant concussion.  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                   Action/Plan: PT/OT recommending SNF for short term rehab at dc.  Discussed this with pt and daughter, June, at bedside (phone [phone]), and they are agreeable to this plan.  They would prefer [facility], as it is near their home.  Will consult CSW to facilitate dc to SNF upon medical stability.  Will follow progress.    Expected Discharge Date:                  Expected Discharge Plan:  Skilled Nursing Facility  In-House Referral:  Clinical Social Work  Discharge planning Services  CM Consult  Post Acute Care Choice:    Choice offered to:     DME Arranged:    DME Agency:     HH Arranged:    [place] Agency:     Status of Service:  In process, will continue to follow  If discussed at Long Length of Stay Meetings, dates discussed:    Additional Comments:  Reinaldo Raddle, RN, BSN  Trauma/Neuro ICU Case Manager [phone]

## 2016-10-16 NOTE — Clinical Social Work Placement (Signed)
   CLINICAL SOCIAL WORK PLACEMENT  NOTE  Date:  10/16/2016  Patient Details  Name: Yvonne Rich MRN: [id number] Date of Birth: 03-11-1941  Clinical Social Work is seeking post-discharge placement for this patient at the [place] level of care (*CSW will initial, date and re-position this form in  chart as items are completed):  Yes   Patient/family provided with [place] Work Department's list of facilities offering this level of care within the geographic area requested by the patient (or if unable, by the patient's family).  Yes   Patient/family informed of their freedom to choose among providers that offer the needed level of care, that participate in Medicare, Medicaid or managed care program needed by the patient, have an available bed and are willing to accept the patient.  Yes   Patient/family informed of [facility]'s ownership interest in [facility] and [facility], as well as of the fact that they are under no obligation to receive care at these facilities.  PASRR submitted to EDS on 10/16/16     PASRR number received on 10/16/16     Existing PASRR number confirmed on       FL2 transmitted to all facilities in geographic area requested by pt/family on 10/16/16     FL2 transmitted to all facilities within larger geographic area on       Patient informed that his/her managed care company has contracts with or will negotiate with certain facilities, including the following:            Patient/family informed of bed offers received.  Patient chooses bed at       Physician recommends and patient chooses bed at      Patient to be transferred to   on  .  Patient to be transferred to facility by       Patient family notified on   of transfer.  Name of family member notified:        PHYSICIAN Please sign FL2     Additional Comment:    _______________________________________________ Serafina Mitchell, LCSWA 10/16/2016,  11:29 AM

## 2016-10-16 NOTE — Clinical Social Work Note (Signed)
Bed offers discussed with pt/dtr. Pt only wants [facility] due to being close to home. CSW notified Florentina Jenny @ [facility] to advise pt accepted bed offer. [facility] will submit auth to [employer].  CSW will continue to follow.  Dylan Ruotolo B. Joline Maxcy Clinical Social Work Dept Weekend Social Worker [phone] 2:36 PM

## 2016-10-16 NOTE — Progress Notes (Signed)
Patient ID: Yvonne Rich, female   DOB: 15-Mar-1941, 76 y.o.   MRN: [id number] Radiographs of the right foot reviewed show no evidence of fracture noted evidence of Lisfranc injury.

## 2016-10-16 NOTE — Clinical Social Work Note (Addendum)
Clinical Social Work Assessment  Patient Details  Name: Yvonne Rich MRN: [id number] Date of Birth: 05-29-1941  Date of referral:  10/16/16               Reason for consult:  Trauma                Permission sought to share information with:  Family Supports Permission granted to share information::  Yes, Verbal Permission Granted  Name::     Yvonne Rich [phone]  Agency::     Relationship::     Contact Information:     Housing/Transportation Living arrangements for the past 2 months:  Single Family Home (3 Ext Stairs.Marland KitchenMarland KitchenMarland KitchenNo Int stairs) Source of Information:  Patient, Adult Children Patient Interpreter Needed:  None Criminal Activity/Legal Involvement Pertinent to Current Situation/Hospitalization:  No - Comment as needed Significant Relationships:  Adult Children, Spouse Lives with:  Spouse Do you feel safe going back to the place where you live?  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 Pt has 2 adult dtrs that live near and are willing to help pt as much as needed.    Social Worker assessment / plan:  Holiday representative met with pt and Yvonne Rich @ bedside. Pt pleasant, alert and oriented X4. Pt reported that she lives in a single family home with 3 steps to enter, pt reports tha she has a basement and due to the cold weather she let the dogs in the basement. Pt attempted to go down and feed dogs and slipped on step and fell. Pt reports that she never goes to the basement and never uses these stairs. Pt feeling a little down about this because she is the caretaker for her spouse who has Dementia. Pt very motivated to get better quickly, pt not really on board with SNF but understands she needs a speedy recovery and this will be the best solution to gaining her independence. Pt plans to return home after SNF stay and pt's dtrs are willing to help pt as much as needed post  DC.  Employment status:  Retired Forensic scientist:  Medicare PT Recommendations:  [place] / Referral to community resources:     Patient/Family's Response to care:  Pt agreeable to SNF placement short term. Pt does not express concerns with nightmares and or flashbacks of her fall.   Patient/Family's Understanding of and Emotional Response to Diagnosis, Current Treatment, and Prognosis:  Pt understands her current dx and potential long term recovery. Patient with good family support who plans to be present for physical and emotional support at DC.  Emotional Assessment Appearance:  Appears stated age Attitude/Demeanor/Rapport:  Other (Pleasant/Cooperative) Affect (typically observed):  Accepting Orientation:  Oriented to Self, Oriented to Situation, Oriented to Place, Oriented to  Time Alcohol / Substance use:  Not Applicable Psych involvement (Current and /or in the community):  No (Comment)  Discharge Needs  Concerns to be addressed:    Readmission within the last 30 days:  No Current discharge risk:  None Barriers to Discharge:  Continued Medical Work up   [place], [place], [place] 10/16/2016, 2:19 PM

## 2016-10-16 NOTE — Progress Notes (Signed)
Physical Therapy Treatment Patient Details Name: Yvonne Rich MRN: [id number] DOB: [birth year] Today's Date: 10/16/2016    History of Present Illness Pt is a 76 yo female admitted on 10/13/16 following a fall resulting in multiple facial fx, L ankle and toe Fx, and significant concussion. PMH significant for Allergies, Asthma, HTN, CKD III, RTC left shoulder 1994, poliomyelitis.      PT Comments    Patient is making gradual progress toward mobilty goals. Continues to be limited by dizziness and nausea with mobility however willing to participate in therapy and eager to return to PLOF. Continue to progress as tolerated with anticipated d/c to SNF for further skilled PT services.    Follow Up Recommendations  SNF;Supervision/Assistance - 24 hour     Equipment Recommendations  None recommended by PT;Other (comment) (defer to next venue of care)    Recommendations for Other Services       Precautions / Restrictions Precautions Precautions: Fall Restrictions Weight Bearing Restrictions: Yes LLE Weight Bearing: Non weight bearing    Mobility  Bed Mobility Overal bed mobility: Needs Assistance Bed Mobility: Supine to Sit     Supine to sit: Min guard;HOB elevated     General bed mobility comments: increased time and effort due to dizziness; min guard for safety  Transfers Overall transfer level: Needs assistance Equipment used: Rolling walker (2 wheeled) Transfers: Stand Pivot Transfers;Sit to/from Stand Sit to Stand: Min assist;+2 safety/equipment Stand pivot transfers: Mod assist;+2 physical assistance       General transfer comment: min A +2 to power up into standing and for weight shifting in standing to maintain NWB L LE; mod +2 to pivot EOB to recliner with assistance to weight shift and manage RW; pt unable to "hop" on R LE; cues for safe hand placement and technique; pt also able to maintian NWB during pivot  Ambulation/Gait             General Gait  Details: unable this session   Stairs            Wheelchair Mobility    Modified Rankin (Stroke Patients Only)       Balance Overall balance assessment: History of Falls                                  Cognition Arousal/Alertness: Awake/alert Behavior During Therapy: WFL for tasks assessed/performed Overall Cognitive Status: Within Functional Limits for tasks assessed                      Exercises      General Comments General comments (skin integrity, edema, etc.): pt reported dizziness is worst with head turns      Pertinent Vitals/Pain Pain Assessment: Faces Faces Pain Scale: Hurts little more Pain Location: Left ankle Pain Descriptors / Indicators: Guarding;Grimacing;Aching Pain Intervention(s): Limited activity within patient's tolerance;Monitored during session;Repositioned;Patient requesting pain meds-RN notified    Home Living                      Prior Function            PT Goals (current goals can now be found in the care plan section) Acute Rehab PT Goals Patient Stated Goal: stop getting dizzy Progress towards PT goals: Progressing toward goals    Frequency    Min 3X/week      PT Plan Current plan remains appropriate  Co-evaluation             End of Session Equipment Utilized During Treatment: Gait belt Activity Tolerance: Other (comment) (dizziness/nausea) Patient left: in chair;with call bell/phone within reach     Time: 0909-0930 PT Time Calculation (min) (ACUTE ONLY): 21 min  Charges:  $Therapeutic Activity: 8-22 mins                    G Codes:      Salina April, PTA Pager: [phone]   10/16/2016, 11:23 AM

## 2016-10-16 NOTE — Progress Notes (Signed)
  Subjective: Headache this morning, nausea gone Difficulty getting up with PT  Objective: Vital signs in last 24 hours: Temp:  [98 F (36.7 C)-98.6 F (37 C)] 98 F (36.7 C) (01/08 0342) Pulse Rate:  [55-69] 56 (01/08 0342) Resp:  [16] 16 (01/08 0342) BP: (130-159)/(58-63) 130/58 (01/08 0342) SpO2:  [95 %-100 %] 95 % (01/08 0342) Last BM Date: 10/13/16  Intake/Output from previous day: 01/07 0701 - 01/08 0700 In: [id number] [P.O.:1080; I.V.:664] Out: -  Intake/Output this shift: No intake/output data recorded.  Exam: Awake and alert Following commands Lungs clear Abdomen soft, NT  Lab Results:   Recent Labs  10/13/16 1330 10/14/16 0626  WBC 13.8* 8.8  HGB 14.3 12.9  HCT 40.6 37.5  PLT 297 263   BMET  Recent Labs  10/13/16 1330 10/14/16 0626  NA 133* 131*  K 3.7 4.0  CL 97* 98*  CO2 25 24  GLUCOSE 127* 125*  BUN 13 13  CREATININE 1.16* 1.13*  CALCIUM 10.3 9.4   PT/INR No results for input(s): LABPROT, INR in the last 72 hours. ABG No results for input(s): PHART, HCO3 in the last 72 hours.  Invalid input(s): PCO2, PO2  Studies/Results: Dg Ankle 2 Views Right  Result Date: 10/15/2016 CLINICAL DATA:  Pain after fall. EXAM: RIGHT ANKLE - 2 VIEW COMPARISON:  None. FINDINGS: No fractures or dislocation identified. No acute abnormalities are noted. IMPRESSION: Negative. Electronically Signed   By: Dorise Bullion III M.D   On: 10/15/2016 13:37   Dg Foot 2 Views Right  Result Date: 10/15/2016 CLINICAL DATA:  Pain after trauma EXAM: RIGHT FOOT - 2 VIEW COMPARISON:  None. FINDINGS: There is no evidence of fracture or dislocation. There is no evidence of arthropathy or other focal bone abnormality. Soft tissues are unremarkable. IMPRESSION: Negative. Electronically Signed   By: Dorise Bullion III M.D   On: 10/15/2016 13:39    Anti-infectives: Anti-infectives    Start     Dose/Rate Route Frequency Ordered Stop   10/13/16 1545  cefTRIAXone (ROCEPHIN) 1 g in  dextrose 5 % 50 mL IVPB     1 g 100 mL/hr over 30 Minutes Intravenous  Once 10/13/16 1533 10/13/16 1649      Assessment/Plan: Fall Concussion/TBI - PT/OT starting today Right zygomatic arch fracture, non-displaced - no surgery needed per [facility] Maxillary sinus fracture Right frontal bone fracture, non-displaced Fracture distal fibula, left -ortho surgery consult, Dr. Sharol Given. No acute surgical needs.  Left toe pain - left foot film - mildly displaced fx head proximal phalanx left great toe Scalp laceration, right - one staple placed in ED  UTI - Rocephin in ED   HTN  HLD Asthma CKD, Stage 3   ID: Rocephin 10/14/15 VTE: Lovenox   Advancing diet   LOS: 3 days    Yvonne Rich A 10/16/2016

## 2016-10-17 DIAGNOSIS — M8580 Other specified disorders of bone density and structure, unspecified site: Secondary | ICD-10-CM | POA: Diagnosis not present

## 2016-10-17 DIAGNOSIS — S92412D Displaced fracture of proximal phalanx of left great toe, subsequent encounter for fracture with routine healing: Secondary | ICD-10-CM | POA: Diagnosis not present

## 2016-10-17 DIAGNOSIS — H4010X0 Unspecified open-angle glaucoma, stage unspecified: Secondary | ICD-10-CM | POA: Diagnosis not present

## 2016-10-17 DIAGNOSIS — H903 Sensorineural hearing loss, bilateral: Secondary | ICD-10-CM | POA: Diagnosis not present

## 2016-10-17 DIAGNOSIS — S060X9A Concussion with loss of consciousness of unspecified duration, initial encounter: Secondary | ICD-10-CM | POA: Diagnosis present

## 2016-10-17 DIAGNOSIS — S82892S Other fracture of left lower leg, sequela: Secondary | ICD-10-CM | POA: Diagnosis not present

## 2016-10-17 DIAGNOSIS — Z9181 History of falling: Secondary | ICD-10-CM | POA: Diagnosis not present

## 2016-10-17 DIAGNOSIS — M25872 Other specified joint disorders, left ankle and foot: Secondary | ICD-10-CM | POA: Diagnosis not present

## 2016-10-17 DIAGNOSIS — S82892D Other fracture of left lower leg, subsequent encounter for closed fracture with routine healing: Secondary | ICD-10-CM | POA: Diagnosis not present

## 2016-10-17 DIAGNOSIS — S0240CD Maxillary fracture, right side, subsequent encounter for fracture with routine healing: Secondary | ICD-10-CM | POA: Diagnosis not present

## 2016-10-17 DIAGNOSIS — S92422A Displaced fracture of distal phalanx of left great toe, initial encounter for closed fracture: Secondary | ICD-10-CM | POA: Diagnosis present

## 2016-10-17 DIAGNOSIS — I10 Essential (primary) hypertension: Secondary | ICD-10-CM | POA: Diagnosis not present

## 2016-10-17 DIAGNOSIS — H8110 Benign paroxysmal vertigo, unspecified ear: Secondary | ICD-10-CM | POA: Diagnosis not present

## 2016-10-17 DIAGNOSIS — S0292XD Unspecified fracture of facial bones, subsequent encounter for fracture with routine healing: Secondary | ICD-10-CM | POA: Diagnosis not present

## 2016-10-17 DIAGNOSIS — S0100XA Unspecified open wound of scalp, initial encounter: Secondary | ICD-10-CM | POA: Diagnosis not present

## 2016-10-17 DIAGNOSIS — S0240EA Zygomatic fracture, right side, initial encounter for closed fracture: Secondary | ICD-10-CM | POA: Diagnosis not present

## 2016-10-17 DIAGNOSIS — S02401A Maxillary fracture, unspecified, initial encounter for closed fracture: Secondary | ICD-10-CM | POA: Diagnosis not present

## 2016-10-17 DIAGNOSIS — S0281XD Fracture of other specified skull and facial bones, right side, subsequent encounter for fracture with routine healing: Secondary | ICD-10-CM | POA: Diagnosis not present

## 2016-10-17 DIAGNOSIS — S0292XA Unspecified fracture of facial bones, initial encounter for closed fracture: Secondary | ICD-10-CM | POA: Diagnosis present

## 2016-10-17 DIAGNOSIS — W109XXA Fall (on) (from) unspecified stairs and steps, initial encounter: Secondary | ICD-10-CM | POA: Diagnosis not present

## 2016-10-17 DIAGNOSIS — J4520 Mild intermittent asthma, uncomplicated: Secondary | ICD-10-CM | POA: Diagnosis not present

## 2016-10-17 DIAGNOSIS — R0989 Other specified symptoms and signs involving the circulatory and respiratory systems: Secondary | ICD-10-CM | POA: Diagnosis not present

## 2016-10-17 DIAGNOSIS — S060X1D Concussion with loss of consciousness of 30 minutes or less, subsequent encounter: Secondary | ICD-10-CM | POA: Diagnosis not present

## 2016-10-17 DIAGNOSIS — N390 Urinary tract infection, site not specified: Secondary | ICD-10-CM | POA: Diagnosis not present

## 2016-10-17 DIAGNOSIS — Z23 Encounter for immunization: Secondary | ICD-10-CM | POA: Diagnosis present

## 2016-10-17 DIAGNOSIS — R2681 Unsteadiness on feet: Secondary | ICD-10-CM | POA: Diagnosis not present

## 2016-10-17 DIAGNOSIS — M6281 Muscle weakness (generalized): Secondary | ICD-10-CM | POA: Diagnosis not present

## 2016-10-17 DIAGNOSIS — H918X1 Other specified hearing loss, right ear: Secondary | ICD-10-CM | POA: Diagnosis not present

## 2016-10-17 DIAGNOSIS — S060XAA Concussion with loss of consciousness status unknown, initial encounter: Secondary | ICD-10-CM | POA: Diagnosis present

## 2016-10-17 DIAGNOSIS — S92422S Displaced fracture of distal phalanx of left great toe, sequela: Secondary | ICD-10-CM | POA: Diagnosis not present

## 2016-10-17 DIAGNOSIS — S92422D Displaced fracture of distal phalanx of left great toe, subsequent encounter for fracture with routine healing: Secondary | ICD-10-CM | POA: Diagnosis not present

## 2016-10-17 DIAGNOSIS — Y92008 Other place in unspecified non-institutional (private) residence as the place of occurrence of the external cause: Secondary | ICD-10-CM | POA: Diagnosis not present

## 2016-10-17 DIAGNOSIS — S0292XS Unspecified fracture of facial bones, sequela: Secondary | ICD-10-CM | POA: Diagnosis not present

## 2016-10-17 DIAGNOSIS — R278 Other lack of coordination: Secondary | ICD-10-CM | POA: Diagnosis not present

## 2016-10-17 DIAGNOSIS — S82232D Displaced oblique fracture of shaft of left tibia, subsequent encounter for closed fracture with routine healing: Secondary | ICD-10-CM | POA: Diagnosis not present

## 2016-10-17 DIAGNOSIS — S0240ED Zygomatic fracture, right side, subsequent encounter for fracture with routine healing: Secondary | ICD-10-CM | POA: Diagnosis not present

## 2016-10-17 DIAGNOSIS — W19XXXA Unspecified fall, initial encounter: Secondary | ICD-10-CM | POA: Diagnosis present

## 2016-10-17 DIAGNOSIS — R42 Dizziness and giddiness: Secondary | ICD-10-CM | POA: Diagnosis not present

## 2016-10-17 DIAGNOSIS — R69 Illness, unspecified: Secondary | ICD-10-CM | POA: Diagnosis not present

## 2016-10-17 MED ORDER — DOCUSATE SODIUM 100 MG PO CAPS
100.0000 mg | ORAL_CAPSULE | Freq: Two times a day (BID) | ORAL | Status: DC
  Administered 2016-10-17: 100 mg via ORAL
  Filled 2016-10-17: qty 1

## 2016-10-17 MED ORDER — DOCUSATE SODIUM 100 MG PO CAPS: 100.0000 mg | ORAL_CAPSULE | Freq: Two times a day (BID) | ORAL | Status: DC

## 2016-10-17 MED ORDER — OXYCODONE-ACETAMINOPHEN 5-325 MG PO TABS: 1.0000 | ORAL_TABLET | ORAL | 0 refills | Status: DC | PRN

## 2016-10-17 MED ORDER — MECLIZINE HCL 25 MG PO TABS: 25.0000 mg | ORAL_TABLET | Freq: Three times a day (TID) | ORAL | Status: DC | PRN

## 2016-10-17 MED ORDER — POLYETHYLENE GLYCOL 3350 17 G PO PACK: 17.0000 g | PACK | Freq: Every day | ORAL | Status: DC

## 2016-10-17 MED ORDER — MECLIZINE HCL 25 MG PO TABS
25.0000 mg | ORAL_TABLET | Freq: Three times a day (TID) | ORAL | Status: DC | PRN
  Administered 2016-10-17: 25 mg via ORAL
  Filled 2016-10-17 (×3): qty 1

## 2016-10-17 MED ORDER — POLYETHYLENE GLYCOL 3350 17 G PO PACK
17.0000 g | PACK | Freq: Every day | ORAL | Status: DC
  Administered 2016-10-17: 17 g via ORAL
  Filled 2016-10-17: qty 1

## 2016-10-17 NOTE — Progress Notes (Signed)
  Subjective: Continued headache. Pain management is appropriate. Dizziness with sitting up this morning. Ambulating with PT is progressing slowly. Tolerating full diet. Denies recent bowel movement, but has had flatus. Denies fever, chills, vision changes, chest pain, SOB, abdominal pain, nausea, vomiting.   Objective: Vital signs in last 24 hours: Temp:  [97.4 F (36.3 C)-98.4 F (36.9 C)] 98.4 F (36.9 C) (01/09 0538) Pulse Rate:  [61-89] 77 (01/09 0959) Resp:  [15-16] 16 (01/09 0538) BP: (125-165)/(45-85) 142/60 (01/09 0959) SpO2:  [98 %-99 %] 98 % (01/09 0538) Last BM Date: 10/13/16  Physical Exam: General Assessment: Alert and in no acute distress. Resting in bedside chair. HEENT: No gross deformities. Mildly dry mucous membranes.  Cardiac: RRR. No murmurs, rubs, gallops appreciated. Resp: Clear to auscultation bilaterally. Abdomen: Nontender to palpation. Soft. Bowel sounds present. M/S: Left lower extremity wrapped. Clean and dry. Left forefoot bruising.    Assessment/Plan: Fall Concussion/TBI - dizziness with sitting up. Headache persists.  Right zygomatic arch fracture, non-displaced - no surgery needed per [facility] Maxillary sinus fracture Right frontal bone fracture, non-displaced Fracture distal fibula, left - ortho surgery consult, Dr. Sharol Given. No acute surgical needs.  Left toe pain - left foot film - mildly displaced fx head proximal phalanx left great toe Scalp laceration, right - one staple placed in ED  UTI - Rocephin in ED   HTN  HLD Asthma CKD, Stage 3   ID: Rocephin 10/14/15 VTE: Lovenox   Plan Continue progress with PT/OT Consider D/C to SNF when bed available     LOS: 4 days    Yvonne Rich 10/17/2016

## 2016-10-17 NOTE — Care Management Important Message (Signed)
Important Message  Patient Details  Name: Yvonne Rich MRN: [id number] Date of Birth: Feb 18, 1941   Medicare Important Message Given:  Yes    [name] 10/17/2016, 11:05 AM

## 2016-10-17 NOTE — Progress Notes (Signed)
Reviewed discharge instructions/medications with patient and patient's daughter.  Answered their questions.  Called report to [facility].  Patient is waiting on transport to pick her up.

## 2016-10-17 NOTE — Discharge Summary (Signed)
Physician Discharge Summary  Patient ID: Yvonne Rich MRN: [id number] DOB/AGE: 06/15/1941 76 y.o.  Admit date: 10/13/2016 Discharge date: 10/17/2016  Discharge Diagnoses Patient Active Problem List   Diagnosis Date Noted  . Fall 10/17/2016  . Concussion 10/17/2016  . Multiple facial fractures ([place]) 10/17/2016  . Closed displaced fracture of distal phalanx of left great toe 10/17/2016  . Closed fracture of left ankle 10/13/2016  . Sciatica of right side   . Allergy   . Asthma   . Glaucoma   . OBESITY 09/17/2007  . POLIOMYELITIS 05/24/2007  . HYPERLIPIDEMIA 05/24/2007  . HYPERTENSION 05/24/2007    Consultants Dr. Meridee Score for orthopedic surgery  Dr. Jodi Marble for ENT   Procedures 1/5 -- Repair of scalp laceration by Domenic Moras, PA-C   HPI: Yvonne Rich presents to [facility] via EMS after a fall in her basement. She was initially amnestic to the event and did not remember how she fell. She reported falling from the stairs in her basement onto the concrete floor after helping her dog out of a crawl space. She reported a loss of consciousness for an unknown amount of time before going upstairs to wake up her husband for help. Her workup included CT scans of the head and cervical spine as well as extremity x-rays which showed the above-mentioned injuries. She was admitted to the trauma service and orthopedic surgery and ENT were consulted. A small scalp laceration was closed in the ED.   Hospital Course: Both orthopedic surgery and ENT recommended non-operative treatment for her fractures. Her pain was controlled on oral medications. She was mobilized with physical and occupational therapies who recommended skilled nursing facility placement. She had some post-concussive symptoms such as dizziness that were managed symptomatically. She was discharged to the facility in good condition.   Allergies as of 10/17/2016      Reactions   Latex Rash   Erythromycin Rash   Sulfonamide  Derivatives Rash      Medication List    TAKE these medications   albuterol 108 (90 Base) MCG/ACT inhaler Commonly known as:  PROAIR HFA Inhale 2 puffs into the lungs every 6 (six) hours as needed for wheezing or shortness of breath.   aspirin 81 MG tablet Take 81 mg by mouth daily.   docusate sodium 100 MG capsule Commonly known as:  COLACE Take 1 capsule (100 mg total) by mouth 2 (two) times daily.   gabapentin 300 MG capsule Commonly known as:  NEURONTIN Take 1 capsule (300 mg total) by mouth 3 (three) times daily.   loratadine 10 MG tablet Commonly known as:  CLARITIN Take 10 mg by mouth daily.   losartan-hydrochlorothiazide 100-25 MG tablet Commonly known as:  HYZAAR TAKE 1 TABLET BY MOUTH ONCE A DAY   meclizine 25 MG tablet Commonly known as:  ANTIVERT Take 1 tablet (25 mg total) by mouth 3 (three) times daily as needed for dizziness.   meloxicam 7.5 MG tablet Commonly known as:  MOBIC Take 1 tablet (7.5 mg total) by mouth daily as needed for pain.   metoprolol tartrate 25 MG tablet Commonly known as:  LOPRESSOR Take 1 tablet (25 mg total) by mouth 2 (two) times daily.   oxyCODONE-acetaminophen 5-325 MG tablet Commonly known as:  ROXICET Take 1-2 tablets by mouth every 4 (four) hours as needed (Pain).   polyethylene glycol packet Commonly known as:  MIRALAX / GLYCOLAX Take 17 g by mouth daily. Start taking on:  10/18/2016   travoprost (benzalkonium) 0.004 %  ophthalmic solution Commonly known as:  TRAVATAN Place 1 drop into both eyes daily.   VITAMIN B-12 PO Take 1 tablet by mouth daily.   Vitamin D (Cholecalciferol) 1000 units Caps Take 2,000 Units by mouth daily.       Follow-up Information    Newt Minion, MD. Schedule an appointment as soon as possible for a visit.   Specialty:  Orthopedic Surgery Contact information: [place] [address] [id number]        Jodi Marble, MD. Schedule an appointment as soon  as possible for a visit.   Specialty:  Otolaryngology Contact information: [place] [address] [phone]        [place] Follow up.   Why:  Call as needed Contact information: [address] [id number] [place] [id number]           Signed: Lisette Abu, PA-C Pager: [phone] General Trauma PA Pager: [phone] 10/17/2016, 4:22 PM

## 2016-10-17 NOTE — Progress Notes (Signed)
Insurance authorization received and [facility] agreeable to accept pt this pm.  D/C information sent to the facility via the [place] and non-emergent ambulance transport arranged.  Pt/daughter updated and agreeable to plan.  RN to call report to [facility] @ [phone].

## 2016-10-19 ENCOUNTER — Encounter: Payer: Self-pay | Admitting: Internal Medicine

## 2016-10-19 ENCOUNTER — Non-Acute Institutional Stay (SKILLED_NURSING_FACILITY): Payer: Medicare Other | Admitting: Internal Medicine

## 2016-10-19 DIAGNOSIS — K5900 Constipation, unspecified: Secondary | ICD-10-CM

## 2016-10-19 DIAGNOSIS — H8113 Benign paroxysmal vertigo, bilateral: Secondary | ICD-10-CM

## 2016-10-19 DIAGNOSIS — S060X1D Concussion with loss of consciousness of 30 minutes or less, subsequent encounter: Secondary | ICD-10-CM | POA: Diagnosis not present

## 2016-10-19 DIAGNOSIS — S0101XS Laceration without foreign body of scalp, sequela: Secondary | ICD-10-CM

## 2016-10-19 DIAGNOSIS — R2681 Unsteadiness on feet: Secondary | ICD-10-CM | POA: Diagnosis not present

## 2016-10-19 DIAGNOSIS — S92422S Displaced fracture of distal phalanx of left great toe, sequela: Secondary | ICD-10-CM | POA: Diagnosis not present

## 2016-10-19 DIAGNOSIS — H8110 Benign paroxysmal vertigo, unspecified ear: Secondary | ICD-10-CM | POA: Diagnosis not present

## 2016-10-19 DIAGNOSIS — R42 Dizziness and giddiness: Secondary | ICD-10-CM | POA: Diagnosis not present

## 2016-10-19 DIAGNOSIS — H9191 Unspecified hearing loss, right ear: Secondary | ICD-10-CM | POA: Insufficient documentation

## 2016-10-19 DIAGNOSIS — S0292XS Unspecified fracture of facial bones, sequela: Secondary | ICD-10-CM | POA: Diagnosis not present

## 2016-10-19 DIAGNOSIS — E871 Hypo-osmolality and hyponatremia: Secondary | ICD-10-CM

## 2016-10-19 DIAGNOSIS — S0240ED Zygomatic fracture, right side, subsequent encounter for fracture with routine healing: Secondary | ICD-10-CM | POA: Diagnosis not present

## 2016-10-19 DIAGNOSIS — S82892S Other fracture of left lower leg, sequela: Secondary | ICD-10-CM | POA: Diagnosis not present

## 2016-10-19 DIAGNOSIS — H903 Sensorineural hearing loss, bilateral: Secondary | ICD-10-CM | POA: Diagnosis not present

## 2016-10-19 DIAGNOSIS — M5431 Sciatica, right side: Secondary | ICD-10-CM

## 2016-10-19 DIAGNOSIS — S060X1A Concussion with loss of consciousness of 30 minutes or less, initial encounter: Secondary | ICD-10-CM | POA: Insufficient documentation

## 2016-10-19 DIAGNOSIS — H918X1 Other specified hearing loss, right ear: Secondary | ICD-10-CM | POA: Diagnosis not present

## 2016-10-19 DIAGNOSIS — I10 Essential (primary) hypertension: Secondary | ICD-10-CM

## 2016-10-19 NOTE — Progress Notes (Signed)
LOCATION: [facility]  PCP: Odette Fraction, MD   Code Status: Full Code  Goals of care: Advanced Directive information Advanced Directives 10/14/2016  Does Patient Have a Medical Advance Directive? No  Would patient like information on creating a medical advance directive? No - Patient declined       Extended Emergency Contact Information Primary Emergency Contact: BRONA, FINNER Address: [place],  [id number] Home Phone: [phone] Relation: None   Allergies  Allergen Reactions  . Latex Rash  . Erythromycin Rash  . Sulfonamide Derivatives Rash    Chief Complaint  Patient presents with  . New Admit To SNF    New Admission Visit      HPI:  Patient is a 76 y.o. female seen today for short term rehabilitation post hospital admission from 10/13/2016-10/17/2016 post fall with multiple fractures. She sustained a closed displaced left great toe fracture, multiple facial fracture, closed left ankle fracture and laceration to her scalp. She underwent repair of her laceration. She was seen by orthopedic and ENT team. No surgery was recommended. She is seen in her room today. Her granddaughter is present at bedside.  Review of Systems:  Constitutional: Negative for fever, chills, diaphoresis.  HENT: Negative for headache, congestion, nasal discharge, sore throat, difficulty swallowing.   Eyes: Negative for blurred vision, double vision and discharge.  Respiratory: Negative for cough, shortness of breath and wheezing.   Cardiovascular: Negative for chest pain, palpitations, leg swelling.  Gastrointestinal: Negative for heartburn, nausea, vomiting, abdominal pain. Last bowel movement was a week back.  Genitourinary: Negative for dysuria and flank pain.  Musculoskeletal: Negative for back pain, fall.  pain under control with current pain regimen. Skin: Negative for itching, rash.  Neurological: positive for dizziness. Psychiatric/Behavioral:  Negative for depression   Past Medical History:  Diagnosis Date  . Allergy   . Asthma   . CKD (chronic kidney disease) stage 3, GFR 30-59 ml/min   . Glaucoma   . Hypertension   . Poliomyelitis   . Sciatica of right side    Past Surgical History:  Procedure Laterality Date  . CHOLECYSTECTOMY  2002  . COSMETIC SURGERY  2012   eye lids  . ROTATOR CUFF REPAIR  1994   Social History:   reports that she has never smoked. She has never used smokeless tobacco. She reports that she does not drink alcohol or use drugs.  Family History  Problem Relation Age of Onset  . Hypertension Father   . Vision loss Father     Medications: Allergies as of 10/19/2016      Reactions   Latex Rash   Erythromycin Rash   Sulfonamide Derivatives Rash      Medication List       Accurate as of 10/19/16  2:47 PM. Always use your most recent med list.          albuterol 108 (90 Base) MCG/ACT inhaler Commonly known as:  PROAIR HFA Inhale 2 puffs into the lungs every 6 (six) hours as needed for wheezing or shortness of breath.   aspirin 81 MG tablet Take 81 mg by mouth daily.   docusate sodium 100 MG capsule Commonly known as:  COLACE Take 1 capsule (100 mg total) by mouth 2 (two) times daily.   gabapentin 300 MG capsule Commonly known as:  NEURONTIN Take 1 capsule (300 mg total) by mouth 3 (three) times daily.   loratadine 10 MG tablet  Commonly known as:  CLARITIN Take 10 mg by mouth daily.   losartan-hydrochlorothiazide 100-25 MG tablet Commonly known as:  HYZAAR TAKE 1 TABLET BY MOUTH ONCE A DAY   meclizine 25 MG tablet Commonly known as:  ANTIVERT Take 1 tablet (25 mg total) by mouth 3 (three) times daily as needed for dizziness.   meloxicam 7.5 MG tablet Commonly known as:  MOBIC Take 1 tablet (7.5 mg total) by mouth daily as needed for pain.   metoprolol tartrate 25 MG tablet Commonly known as:  LOPRESSOR Take 1 tablet (25 mg total) by mouth 2 (two) times daily.     oxyCODONE-acetaminophen 5-325 MG tablet Commonly known as:  ROXICET Take 1-2 tablets by mouth every 4 (four) hours as needed (Pain).   polyethylene glycol packet Commonly known as:  MIRALAX / GLYCOLAX Take 17 g by mouth daily.   travoprost (benzalkonium) 0.004 % ophthalmic solution Commonly known as:  TRAVATAN Place 1 drop into both eyes daily.   Vitamin D (Cholecalciferol) 1000 units Caps Take 2,000 Units by mouth daily.       Immunizations: Immunization History  Administered Date(s) Administered  . Influenza-Unspecified 08/10/2015  . Pneumococcal Polysaccharide-23 10/09/2010  . Td 10/16/2000  . Tdap 10/13/2016     Physical Exam: Vitals:   10/19/16 1436  BP: (!) 144/79  Pulse: 63  Resp: 20  Temp: 97.9 F (36.6 C)  TempSrc: Oral  SpO2: 97%  Weight: 225 lb (102.1 kg)  Height: 5\' 9"  (1.753 m)   Body mass index is 33.23 kg/m.  General- elderly female, Obese, in no acute distress Head- normocephalic, atraumatic Nose- no nasal discharge Throat- moist mucus membrane  Eyes- PERRLA, EOMI, no pallor, no icterus, no discharge, normal conjunctiva, normal sclera Neck- no cervical lymphadenopathy Cardiovascular- normal s1,s2, no murmur Respiratory- bilateral clear to auscultation, no wheeze, no rhonchi, no crackles, no use of accessory muscles Abdomen- bowel sounds present, soft, non tender Musculoskeletal- able to move all 4 extremities, left leg and soft, able to move her toes  Neurological- alert and oriented to person, place and time Skin- warm and dry, resolving bruise to her face, laceration to right scalp with one staple in place Psychiatry- normal mood and affect    Labs reviewed: Basic Metabolic Panel:  Recent Labs  10/13/16 1330 10/14/16 0626  NA 133* 131*  K 3.7 4.0  CL 97* 98*  CO2 25 24  GLUCOSE 127* 125*  BUN 13 13  CREATININE 1.16* 1.13*  CALCIUM 10.3 9.4   Liver Function Tests:  Recent Labs  10/13/16 1330  AST 49*  ALT 24   ALKPHOS 57  BILITOT 1.0  PROT 7.2  ALBUMIN 4.1   No results for input(s): LIPASE, AMYLASE in the last 8760 hours. No results for input(s): AMMONIA in the last 8760 hours. CBC:  Recent Labs  10/13/16 1330 10/14/16 0626  WBC 13.8* 8.8  NEUTROABS 12.5*  --   HGB 14.3 12.9  HCT 40.6 37.5  MCV 92.1 94.0  PLT 297 263   Cardiac Enzymes: No results for input(s): CKTOTAL, CKMB, CKMBINDEX, TROPONINI in the last 8760 hours. BNP: Invalid input(s): POCBNP CBG: No results for input(s): GLUCAP in the last 8760 hours.  Radiological Exams: Dg Shoulder Right  Result Date: 10/13/2016 CLINICAL DATA:  Fall today with right shoulder injury. Initial encounter. EXAM: RIGHT SHOULDER - 2+ VIEW COMPARISON:  None. FINDINGS: Atypical patient positioning due to discomfort. Negative for acute fracture or dislocation. Surgical anchor on the proximal humerus. Osteopenia. IMPRESSION: No  acute finding. Electronically Signed   By: Monte Fantasia M.D.   On: 10/13/2016 14:50   Dg Ankle 2 Views Right  Result Date: 10/15/2016 CLINICAL DATA:  Pain after fall. EXAM: RIGHT ANKLE - 2 VIEW COMPARISON:  None. FINDINGS: No fractures or dislocation identified. No acute abnormalities are noted. IMPRESSION: Negative. Electronically Signed   By: Dorise Bullion III M.D   On: 10/15/2016 13:37   Dg Ankle Complete Left  Result Date: 10/13/2016 CLINICAL DATA:  Fall at 7:30 a.m. today. Left ankle swelling. Medial bruising. EXAM: LEFT ANKLE COMPLETE - 3+ VIEW COMPARISON:  None. FINDINGS: An oblique fracture is present in the distal tibia. There is soft tissue swelling over the medial malleolus without the medial fracture. The ankle joint is located. A plantar calcaneal spur is noted. IMPRESSION: 1. Minimally displaced oblique fracture of the distal fibula. 2. Soft tissue swelling over the medial malleolus. Electronically Signed   By: San Morelle M.D.   On: 10/13/2016 14:51   Ct Head Wo Contrast  Result Date:  10/13/2016 CLINICAL DATA:  Syncope with fall striking the right side of the head. EXAM: CT HEAD WITHOUT CONTRAST CT CERVICAL SPINE WITHOUT CONTRAST TECHNIQUE: Multidetector CT imaging of the head and cervical spine was performed following the standard protocol without intravenous contrast. Multiplanar CT image reconstructions of the cervical spine were also generated. COMPARISON:  None. FINDINGS: CT HEAD FINDINGS Brain: Mild age related atrophy. Chronic appearing small vessel change of the pons in the cerebral hemispheric white matter. No sign of acute infarction, mass lesion, hemorrhage, hydrocephalus or extra-axial collection. Vascular: There is atherosclerotic calcification of the major vessels at the base of the brain. Skull: Suspicion of minimal nondisplaced skull fracture in the right posterior frontal region. Sinuses/Orbits: Small amount of fluid dependent in the right maxillary sinus. Nondisplaced fracture of the lateral wall right maxillary sinus. Nondisplaced fracture of the zygomatic arch on the right. Other: Right scalp swelling. CT CERVICAL SPINE FINDINGS Alignment: Normal Skull base and vertebrae: No fracture Soft tissues and spinal canal: Negative Disc levels:  Ordinary spondylosis.  No advanced pathology. Upper chest: Pleural and parenchymal scarring. Other: None IMPRESSION: Trauma to the right side of the head. Nondisplaced fracture of the right zygomatic arch and lateral wall of the right maxillary sinus. Nondisplaced fracture of the right frontal bone. No intracranial hemorrhage or injury. Atrophy and chronic small vessel change of the brain. No cervical spine injury.  Ordinary spondylosis. Electronically Signed   By: Nelson Chimes M.D.   On: 10/13/2016 14:17   Ct Cervical Spine Wo Contrast  Result Date: 10/13/2016 CLINICAL DATA:  Syncope with fall striking the right side of the head. EXAM: CT HEAD WITHOUT CONTRAST CT CERVICAL SPINE WITHOUT CONTRAST TECHNIQUE: Multidetector CT imaging of the head  and cervical spine was performed following the standard protocol without intravenous contrast. Multiplanar CT image reconstructions of the cervical spine were also generated. COMPARISON:  None. FINDINGS: CT HEAD FINDINGS Brain: Mild age related atrophy. Chronic appearing small vessel change of the pons in the cerebral hemispheric white matter. No sign of acute infarction, mass lesion, hemorrhage, hydrocephalus or extra-axial collection. Vascular: There is atherosclerotic calcification of the major vessels at the base of the brain. Skull: Suspicion of minimal nondisplaced skull fracture in the right posterior frontal region. Sinuses/Orbits: Small amount of fluid dependent in the right maxillary sinus. Nondisplaced fracture of the lateral wall right maxillary sinus. Nondisplaced fracture of the zygomatic arch on the right. Other: Right scalp swelling. CT CERVICAL SPINE  FINDINGS Alignment: Normal Skull base and vertebrae: No fracture Soft tissues and spinal canal: Negative Disc levels:  Ordinary spondylosis.  No advanced pathology. Upper chest: Pleural and parenchymal scarring. Other: None IMPRESSION: Trauma to the right side of the head. Nondisplaced fracture of the right zygomatic arch and lateral wall of the right maxillary sinus. Nondisplaced fracture of the right frontal bone. No intracranial hemorrhage or injury. Atrophy and chronic small vessel change of the brain. No cervical spine injury.  Ordinary spondylosis. Electronically Signed   By: Nelson Chimes M.D.   On: 10/13/2016 14:17   Dg Foot 2 Views Right  Result Date: 10/15/2016 CLINICAL DATA:  Pain after trauma EXAM: RIGHT FOOT - 2 VIEW COMPARISON:  None. FINDINGS: There is no evidence of fracture or dislocation. There is no evidence of arthropathy or other focal bone abnormality. Soft tissues are unremarkable. IMPRESSION: Negative. Electronically Signed   By: Dorise Bullion III M.D   On: 10/15/2016 13:39   Dg Foot Complete Left  Result Date:  10/13/2016 CLINICAL DATA:  Right great toe pain since a fall this morning the patient is tall but not for over. Initial encounter. EXAM: LEFT FOOT - COMPLETE 3+ VIEW COMPARISON:  None. FINDINGS: The patient has a fracture with the intra-articular extension through the head of the proximal phalanx of the great toe. The fracture is minimally displaced. Distal fibular fracture is noted with the patient now in a fiberglass splint. IMPRESSION: Mildly displaced intra-articular fracture head of the proximal phalanx of left great toe. Electronically Signed   By: Inge Rise M.D.   On: 10/13/2016 18:35    Assessment/Plan  Unsteady gait Post fall with fractures. Will need for patient to work with physical therapy and occupational therapy to help with gait training, strengthening exercises and balance training. Fall precautions to be taken.  Closed fracture of left ankle Seen by orthopedic surgery and conservative management has been recommended for now. Nonweightbearing to left lower extremity for 8 weeks. Patient to work with physical therapy and occupational therapy on gait training and balance. Continue meloxicam 7.5 mg daily as needed and Roxicet 5-3 25 mg 1-2 tablets every 4 hours as needed for pain. PMR consult. Continue vitamin D supplement.  Closed displaced left great toe fracture Continue pain management and therapy as above.  Multiple facial fractures Seen by ENT today. Continue pain management.  Scalp laceration Has a staple in place. Laceration has closed well. Will have the staples be removed tomorrow.  Hyponatremia Check BMP  Constipation Continue Colace twice a day with MiraLAX daily and monitor. Hydration encouraged.  Hypertension On metoprolol tartrate 25 mg twice a day, Hyzaar 100-25 milligrams daily. Monitor blood pressure readings given her complaint of dizziness. Continue baby aspirin.  Vertigo Continue meclizine 25 mg 3 times a day as needed and  monitor  sciatica Continue gabapentin 300 mg 3 times a day and monitor.   Goals of care: short term rehabilitation   Labs/tests ordered: CBC, CMP 10/23/16  Family/ staff Communication: reviewed care plan with patient, her grand-daughter and nursing supervisor    Blanchie Serve, MD Internal Medicine [place], [place] [address] Cell Phone (Monday-Friday 8 am - 5 pm): [phone] On Call: [phone] and follow prompts after 5 pm and on weekends Office Phone: [phone] Office Fax: [phone]

## 2016-10-20 DIAGNOSIS — R69 Illness, unspecified: Secondary | ICD-10-CM | POA: Diagnosis not present

## 2016-10-23 DIAGNOSIS — R69 Illness, unspecified: Secondary | ICD-10-CM | POA: Diagnosis not present

## 2016-10-26 ENCOUNTER — Other Ambulatory Visit: Payer: Self-pay | Admitting: *Deleted

## 2016-10-26 MED ORDER — OXYCODONE-ACETAMINOPHEN 5-325 MG PO TABS: ORAL_TABLET | ORAL | 0 refills | Status: DC

## 2016-10-26 NOTE — Telephone Encounter (Signed)
[facility] [phone] Fax: [phone]  

## 2016-10-27 ENCOUNTER — Ambulatory Visit (INDEPENDENT_AMBULATORY_CARE_PROVIDER_SITE_OTHER): Payer: Medicare HMO | Admitting: Orthopedic Surgery

## 2016-10-27 ENCOUNTER — Ambulatory Visit (INDEPENDENT_AMBULATORY_CARE_PROVIDER_SITE_OTHER): Payer: Medicare HMO

## 2016-10-27 VITALS — Ht 69.0 in | Wt 225.0 lb

## 2016-10-27 DIAGNOSIS — S82892D Other fracture of left lower leg, subsequent encounter for closed fracture with routine healing: Secondary | ICD-10-CM

## 2016-10-27 DIAGNOSIS — S92412D Displaced fracture of proximal phalanx of left great toe, subsequent encounter for fracture with routine healing: Secondary | ICD-10-CM

## 2016-10-27 NOTE — Progress Notes (Signed)
Office Visit Note   Patient: [facility]           Date of Birth: 02-Jan-1941           MRN: [id number] Visit Date: 10/27/2016              Requested by: Susy Frizzle, MD [address] [facility] [place], [place] [address] PCP: Odette Fraction, MD  Chief Complaint  Patient presents with  . Left Ankle - Follow-up    nondisplaced Weber B fibular fracture left ankle 10/13/16    HPI: Patient is follow up for a non surgical nondisplaced Weber B fibular fracture left ankle. She was in a sugar tong splint and non weightbearing in a wheelchair. She is bruised and swollen and states that she has been trying to keep her foot elevated but due to "equilibrium issues" she has to sit up at times and this causes swelling. She is doing physical therapy to work on upper body strength and does not have any questions at this time. Pamella Pert, RMA     Assessment & Plan: Visit Diagnoses:  1. Closed left ankle fracture, with routine healing, subsequent encounter   2. Closed displaced fracture of proximal phalanx of left great toe with routine healing, subsequent encounter     Plan: Continue elevation and nonweightbearing on the left we'll place her fracture boot at this time.   Repeat three-view radiographs of the left ankle and three-view radiographs of the left foot at follow-up.  Follow-Up Instructions: No Follow-up on file.   Ortho Exam On examination patient is alert oriented no adenopathy well-dressed normal affect normal respiratory effort she is ambulating in a wheelchair. Examination she has swelling ecchymosis and bruising around the left ankle the skin is intact there is no cellulitis. Her foot is plantar grade. Left great toe shows some swelling and tenderness to palpation as well.  Patient complains of swelling and pain with dependency of her ankle.  Imaging: Xr Ankle Complete Left  Result Date: 10/27/2016 Three-view radiographs of the left ankle shows a nondisplaced  Weber B left fibular fracture with 1 mm of shortening and 1 mm widening of the medial gutter.  Xr Foot Complete Left  Result Date: 10/27/2016 3 view radiographs of the left foot shows a stable nondisplaced fracture proximal phalanx left great toe   Orders:  Orders Placed This Encounter  Procedures  . XR Ankle Complete Left  . XR Foot Complete Left   No orders of the defined types were placed in this encounter.    Procedures: No procedures performed  Clinical Data: No additional findings.  Subjective: Review of Systems  Objective: Vital Signs: Ht 5\' 9"  (1.753 m)   Wt 225 lb (102.1 kg)   BMI 33.23 kg/m   Specialty Comments:  No specialty comments available.  PMFS History: Patient Active Problem List   Diagnosis Date Noted  . Fall 10/17/2016  . Concussion 10/17/2016  . Multiple facial fractures ([place]) 10/17/2016  . Closed displaced fracture of distal phalanx of left great toe 10/17/2016  . Closed left ankle fracture, with routine healing, subsequent encounter 10/13/2016  . Sciatica of right side   . Allergy   . Asthma   . Glaucoma   . OBESITY 09/17/2007  . POLIOMYELITIS 05/24/2007  . HYPERLIPIDEMIA 05/24/2007  . Essential hypertension 05/24/2007   Past Medical History:  Diagnosis Date  . Allergy   . Asthma   . CKD (chronic kidney disease) stage 3, GFR 30-59  ml/min   . Glaucoma   . Hypertension   . Poliomyelitis   . Sciatica of right side     Family History  Problem Relation Age of Onset  . Hypertension Father   . Vision loss Father     Past Surgical History:  Procedure Laterality Date  . CHOLECYSTECTOMY  2002  . COSMETIC SURGERY  2012   eye lids  . [place]   Social History   Occupational History  . Not on file.   Social History Main Topics  . Smoking status: Never Smoker  . Smokeless tobacco: Never Used  . Alcohol use No  . Drug use: No  . Sexual activity: Yes

## 2016-10-30 DIAGNOSIS — R69 Illness, unspecified: Secondary | ICD-10-CM | POA: Diagnosis not present

## 2016-10-31 DIAGNOSIS — R0989 Other specified symptoms and signs involving the circulatory and respiratory systems: Secondary | ICD-10-CM | POA: Diagnosis not present

## 2016-11-01 ENCOUNTER — Non-Acute Institutional Stay (SKILLED_NURSING_FACILITY): Payer: Medicare HMO | Admitting: Family

## 2016-11-01 DIAGNOSIS — I10 Essential (primary) hypertension: Secondary | ICD-10-CM | POA: Diagnosis not present

## 2016-11-01 DIAGNOSIS — S92422D Displaced fracture of distal phalanx of left great toe, subsequent encounter for fracture with routine healing: Secondary | ICD-10-CM | POA: Diagnosis not present

## 2016-11-01 DIAGNOSIS — R2681 Unsteadiness on feet: Secondary | ICD-10-CM

## 2016-11-01 DIAGNOSIS — S82892D Other fracture of left lower leg, subsequent encounter for closed fracture with routine healing: Secondary | ICD-10-CM | POA: Diagnosis not present

## 2016-11-01 DIAGNOSIS — S0292XD Unspecified fracture of facial bones, subsequent encounter for fracture with routine healing: Secondary | ICD-10-CM | POA: Diagnosis not present

## 2016-11-01 DIAGNOSIS — H4010X0 Unspecified open-angle glaucoma, stage unspecified: Secondary | ICD-10-CM

## 2016-11-01 DIAGNOSIS — J4520 Mild intermittent asthma, uncomplicated: Secondary | ICD-10-CM

## 2016-11-01 NOTE — Progress Notes (Signed)
Location:  [facility] Room Number: [id number]  Place of Service:  SNF (32)  Provider:Reshunda Strider FNP-C   PCP: Odette Fraction, MD Patient Care Team: Susy Frizzle, MD as PCP - General (Family Medicine) Susy Frizzle, MD (Family Medicine)  Extended Emergency Contact Information Primary Emergency Contact: Maryelizabeth Rowan Address: [address]          [place]  [place] Home Phone: [phone] Relation: None  Code Status: Full Code  Goals of care:  Advanced Directive information Advanced Directives 10/14/2016  Does Patient Have a Medical Advance Directive? No  Would patient like information on creating a medical advance directive? No - Patient declined     Allergies  Allergen Reactions  . Latex Rash  . Erythromycin Rash  . Sulfonamide Derivatives Rash    Chief Complaint  Patient presents with  . Discharge Note    Discharge home     HPI:  76 y.o. female seen today at [facility] for discharge home. She was here for short term rehabilitation post hospital admission from 10/13/2016-10/17/2016 post fall with multiple fractures. She sustained a closed displaced left great toe fracture, multiple facial fracture, closed left ankle fracture and laceration to her scalp. She underwent repair of her laceration. She was seen by orthopedic and ENT team. No surgery was recommended.She has a medical history of HTN, Obesity, Asthma,Hyperlipidemia, poliomyelitis, glaucoma among other conditions. She is seen in her room today with Husband at bedside.She states pain under control except still has headaches and soreness sometimes.She is NWB on LLE.She has had unremarkable stay here in rehab.She has worked well with PT/OT now stable for discharge home.She will be discharged home with Home health PT/OT to continue with ROM, Exercise, Gait stability and muscle strengthening.She does not require any DME has own FWW and 3-1 at home.Home health  services will be arranged by facility social worker prior to discharge. Prescription medication will be written x 1 month then patient to follow up with PCP in 1-2 weeks.Facility staff report no new concerns.      Past Medical History:  Diagnosis Date  . Allergy   . Asthma   . CKD (chronic kidney disease) stage 3, GFR 30-59 ml/min   . Glaucoma   . Hypertension   . Poliomyelitis   . Sciatica of right side     Past Surgical History:  Procedure Laterality Date  . CHOLECYSTECTOMY  2002  . COSMETIC SURGERY  2012   eye lids  . [place]      reports that she has never smoked. She has never used smokeless tobacco. She reports that she does not drink alcohol or use drugs. Social History   Social History  . Marital status: Married    Spouse name: N/A  . Number of children: N/A  . Years of education: N/A   Occupational History  . Not on file.   Social History Main Topics  . Smoking status: Never Smoker  . Smokeless tobacco: Never Used  . Alcohol use No  . Drug use: No  . Sexual activity: Yes   Other Topics Concern  . Not on file   Social History Narrative  . No narrative on file    Allergies  Allergen Reactions  . Latex Rash  . Erythromycin Rash  . Sulfonamide Derivatives Rash    Pertinent  Health Maintenance Due  Topic Date Due  . DEXA SCAN  09/11/2006  . INFLUENZA VACCINE  05/09/2016  . COLONOSCOPY  10/09/2021  . PNA vac Low Risk Adult  Completed    Medications: Allergies as of 11/01/2016      Reactions   Latex Rash   Erythromycin Rash   Sulfonamide Derivatives Rash      Medication List       Accurate as of 11/01/16  3:26 PM. Always use your most recent med list.          acetaminophen 500 MG tablet Commonly known as:  TYLENOL Take 1,000 mg by mouth every 6 (six) hours as needed for mild pain.   albuterol 108 (90 Base) MCG/ACT inhaler Commonly known as:  PROAIR HFA Inhale 2 puffs into the lungs every 6 (six) hours as needed  for wheezing or shortness of breath.   aspirin 81 MG tablet Take 81 mg by mouth daily.   docusate sodium 100 MG capsule Commonly known as:  COLACE Take 1 capsule (100 mg total) by mouth 2 (two) times daily.   gabapentin 300 MG capsule Commonly known as:  NEURONTIN Take 1 capsule (300 mg total) by mouth 3 (three) times daily.   loratadine 10 MG tablet Commonly known as:  CLARITIN Take 10 mg by mouth daily.   losartan-hydrochlorothiazide 100-25 MG tablet Commonly known as:  HYZAAR TAKE 1 TABLET BY MOUTH ONCE A DAY   meclizine 25 MG tablet Commonly known as:  ANTIVERT Take 1 tablet (25 mg total) by mouth 3 (three) times daily as needed for dizziness.   meloxicam 7.5 MG tablet Commonly known as:  MOBIC Take 1 tablet (7.5 mg total) by mouth daily as needed for pain.   metoprolol tartrate 25 MG tablet Commonly known as:  LOPRESSOR Take 1 tablet (25 mg total) by mouth 2 (two) times daily.   oxyCODONE 5 MG immediate release tablet Commonly known as:  Oxy IR/ROXICODONE Take 5-10 mg by mouth every 4 (four) hours as needed for severe pain. Take  1-2 Tablet every 4 hours for pain. One tablet for moderate pain and two tablets for severe pain.   polyethylene glycol packet Commonly known as:  MIRALAX / GLYCOLAX Take 17 g by mouth daily.   sennosides-docusate sodium 8.6-50 MG tablet Commonly known as:  SENOKOT-S Take 1 tablet by mouth 2 (two) times daily.   travoprost (benzalkonium) 0.004 % ophthalmic solution Commonly known as:  TRAVATAN Place 1 drop into both eyes daily.   Vitamin D (Cholecalciferol) 1000 units Caps Take 2,000 Units by mouth daily.       Review of Systems  Constitutional: Negative for activity change, appetite change, chills, fatigue and fever.  HENT: Negative for congestion, rhinorrhea, sinus pressure, sneezing, sore throat and trouble swallowing.   Eyes: Negative.   Respiratory: Negative for cough, chest tightness, shortness of breath and wheezing.     Cardiovascular: Negative for chest pain, palpitations and leg swelling.  Gastrointestinal: Negative for abdominal distention, abdominal pain, constipation, diarrhea, nausea and vomiting.  Endocrine: Negative.   Genitourinary: Negative for dysuria, flank pain, frequency and urgency.  Musculoskeletal: Positive for gait problem.       Non-weight bearing LLE   Skin: Negative for color change, pallor and rash.  Neurological: Negative for dizziness, seizures, syncope and light-headedness.       Headaches sometimes relieved by Tylenol.   Hematological: Does not bruise/bleed easily.  Psychiatric/Behavioral: Negative for agitation, confusion, hallucinations and sleep disturbance. The patient is not nervous/anxious.     Vitals:   11/01/16 1000  BP: 134/64  Pulse: 60  Resp: 20  Temp: 97.3 F (36.3 C)  SpO2: 99%  Weight: 213 lb 6.4 oz (96.8 kg)  Height: 5\' 9"  (1.753 m)   Body mass index is 31.51 kg/m. Physical Exam  Constitutional: She is oriented to person, place, and time. She appears well-developed and well-nourished. No distress.  HENT:  Head: Normocephalic.  Mouth/Throat: Oropharynx is clear and moist. No oropharyngeal exudate.  Neck: Normal range of motion. No JVD present. No thyromegaly present.  Cardiovascular: Normal rate, regular rhythm, normal heart sounds and intact distal pulses.  Exam reveals no gallop and no friction rub.   No murmur heard. Pulmonary/Chest: Effort normal and breath sounds normal. No respiratory distress. She has no wheezes. She has no rales.  Abdominal: Soft. Bowel sounds are normal. She exhibits no distension. There is no tenderness. There is no rebound and no guarding.  Musculoskeletal: She exhibits no edema, tenderness or deformity.  NWB LLE   Lymphadenopathy:    She has no cervical adenopathy.  Neurological: She is oriented to person, place, and time.  Skin: Skin is warm and dry. No rash noted. No erythema. No pallor.  Psychiatric: She has a normal  mood and affect.    Labs reviewed: Basic Metabolic Panel:  Recent Labs  10/13/16 1330 10/14/16 0626  NA 133* 131*  K 3.7 4.0  CL 97* 98*  CO2 25 24  GLUCOSE 127* 125*  BUN 13 13  CREATININE 1.16* 1.13*  CALCIUM 10.3 9.4   Liver Function Tests:  Recent Labs  10/13/16 1330  AST 49*  ALT 24  ALKPHOS 57  BILITOT 1.0  PROT 7.2  ALBUMIN 4.1   CBC:  Recent Labs  10/13/16 1330 10/14/16 0626  WBC 13.8* 8.8  NEUTROABS 12.5*  --   HGB 14.3 12.9  HCT 40.6 37.5  MCV 92.1 94.0  PLT 297 263    Assessment/Plan:   1. Unsteady gait Has worked well with PT/ OT. No weight bearing LLE. Will discharge home PT/OT to continue with ROM, Exercise and muscle strengthening. No DME required has own FWW at home. Fall and safety precautions.   2. Essential hypertension B/p stable. Continue on Metoprolol 25 mg twice daily and  Hyzaar 100-25 mg Tablet daily. BMP in 1-2 weeks with PCP   3. Mild intermittent asthma without complication Controlled. Continue on Albuterol inhaler every 6 HRS as needed for wheezing or shortness of breath.    4. Open-angle glaucoma Continue travoprost eye drops. Continue to follow up with Opthalmologist.   5. Closed left ankle fracture, with routine healing, subsequent encounter Status post short term rehabilitation post hospital admission from 10/13/2016-10/17/2016 post fall with multiple fractures.Continue current pain regimen. Continue on ASA.will discharge home with PT/OT for ROM, exercise and muscle strengthening. Follow up with orthopedic as directed. NWB to LLE.   6. Multiple open fractures of facial bones with routine healing, subsequent encounter Status post short term rehabilitation post hospital admission from 10/13/2016-10/17/2016 post fall with multiple facial fractures. Seen by orthopedic and ENT team no surgery was recommended. Still headaches sometimes but has improved. Laceration healed. Continue current pain regimen. Continue to monitor.    7. Closed displaced fracture of distal phalanx of left great toe with routine healing, subsequent encounter Post rehab as above. Continue NWB to LLE. Follow up with Ortho as directed.   Patient is being discharged with the following home health services:   - PT/OT for ROM, Exercise, gait stability and muscle strengthening.   Patient is being discharged with the following  durable medical equipment:   -None required has own FWW and 3-1 at home.   Patient has been advised to f/u with their PCP in 1-2 weeks to for a transitions of care visit.  Social services at their facility was responsible for arranging this appointment.  Pt was provided with adequate prescriptions of noncontrolled medications to reach the scheduled appointment .  For controlled substances, a limited supply was provided as appropriate for the individual patient.  If the pt normally receives these medications from a pain clinic or has a contract with another physician, these medications should be received from that clinic or physician only).    Future labs/tests needed:  CBC, BMP in 1-2 weeks with PCP

## 2016-11-14 ENCOUNTER — Encounter: Payer: Self-pay | Admitting: Family Medicine

## 2016-11-14 ENCOUNTER — Ambulatory Visit (INDEPENDENT_AMBULATORY_CARE_PROVIDER_SITE_OTHER): Payer: Medicare HMO | Admitting: Family Medicine

## 2016-11-14 VITALS — BP 126/70 | HR 80 | Temp 98.4°F | Resp 18 | Ht 69.0 in | Wt 215.0 lb

## 2016-11-14 DIAGNOSIS — Z09 Encounter for follow-up examination after completed treatment for conditions other than malignant neoplasm: Secondary | ICD-10-CM

## 2016-11-14 DIAGNOSIS — S0292XS Unspecified fracture of facial bones, sequela: Secondary | ICD-10-CM | POA: Diagnosis not present

## 2016-11-14 DIAGNOSIS — R42 Dizziness and giddiness: Secondary | ICD-10-CM | POA: Diagnosis not present

## 2016-11-14 DIAGNOSIS — R69 Illness, unspecified: Secondary | ICD-10-CM | POA: Diagnosis not present

## 2016-11-14 DIAGNOSIS — W19XXXS Unspecified fall, sequela: Secondary | ICD-10-CM | POA: Diagnosis not present

## 2016-11-14 DIAGNOSIS — F0781 Postconcussional syndrome: Secondary | ICD-10-CM | POA: Diagnosis not present

## 2016-11-14 NOTE — Progress Notes (Signed)
Subjective:    Patient ID: Yvonne Rich, female    DOB: 1941-05-04, 76 y.o.   MRN: [id number]  HPI Unfortunately, the patient was recently admitted to the hospital after a fall. I have copied relevant portions of the discharge summary and included them below for my reference:  Admit date: 10/13/2016 Discharge date: 10/17/2016  Discharge Diagnoses     Patient Active Problem List   Diagnosis Date Noted  . Fall 10/17/2016  . Concussion 10/17/2016  . Multiple facial fractures ([place]) 10/17/2016  . Closed displaced fracture of distal phalanx of left great toe 10/17/2016  . Closed fracture of left ankle 10/13/2016  . Sciatica of right side   . Allergy   . Asthma   . Glaucoma   . OBESITY 09/17/2007  . POLIOMYELITIS 05/24/2007  . HYPERLIPIDEMIA 05/24/2007  . HYPERTENSION 05/24/2007    Consultants Dr. Meridee Score for orthopedic surgery  Dr. Jodi Marble for ENT   Procedures 1/5 -- Repair of scalp laceration by Domenic Moras, PA-C   HPI: Yvonne Rich presents to [facility] via EMS after a fall in her basement. She was initially amnestic to the event and did not remember how she fell. She reported falling from the stairs in her basement onto the concrete floor after helping her dog out of a crawl space. She reported a loss of consciousness for an unknown amount of time before going upstairs to wake up her husband for help. Her workup included CT scans of the head and cervical spine as well as extremity x-rays which showed the above-mentioned injuries. She was admitted to the trauma service and orthopedic surgery and ENT were consulted. A small scalp laceration was closed in the ED.   Hospital Course: Both orthopedic surgery and ENT recommended non-operative treatment for her fractures. Her pain was controlled on oral medications. She was mobilized with physical and occupational therapies who recommended skilled nursing facility placement. She had some post-concussive symptoms such  as dizziness that were managed symptomatically. She was discharged to the facility in good condition.   Patient is here today coming by her daughter. She now has more memories of the incident. She is trying to pull heard all from under a step when she lost her balance falling and striking her head on the floor. There was no syncope. Since discharge from the hospital she continues to have daily headaches. Originally they were in 8 or 9 on a scale of 10. Now they're down to 4. So they are gradually improving. She also has daily vertigo triggered by position changes. This is slowly improving. Her appetite is starting to improve. She denies any cough. She denies any shortness of breath. There is no pitting edema in her left leg which is early in a [place] due to her ankle fracture. There is no evidence of a DVT. She denies symptoms of PE. She denies any dysuria. She denies any constipation. She is treating this with prune juice and plenty of fluids. Overall she is doing relatively well. She follows up with orthopedist next week. The ENT doctor has determined that her facial fractures required no surgery Past Medical History:  Diagnosis Date  . Allergy   . Asthma   . CKD (chronic kidney disease) stage 3, GFR 30-59 ml/min   . Glaucoma   . Hypertension   . Poliomyelitis   . Sciatica of right side    Past Surgical History:  Procedure Laterality Date  . CHOLECYSTECTOMY  2002  . COSMETIC SURGERY  2012  eye lids  . ROTATOR CUFF REPAIR  1994   Current Outpatient Prescriptions on File Prior to Visit  Medication Sig Dispense Refill  . acetaminophen (TYLENOL) 500 MG tablet Take 1,000 mg by mouth every 6 (six) hours as needed for mild pain.    Marland Kitchen albuterol (PROAIR HFA) 108 (90 BASE) MCG/ACT inhaler Inhale 2 puffs into the lungs every 6 (six) hours as needed for wheezing or shortness of breath. 1 Inhaler 1  . aspirin 81 MG tablet Take 81 mg by mouth daily.    Marland Kitchen docusate sodium (COLACE) 100 MG capsule  Take 1 capsule (100 mg total) by mouth 2 (two) times daily.    Marland Kitchen gabapentin (NEURONTIN) 300 MG capsule Take 1 capsule (300 mg total) by mouth 3 (three) times daily. 90 capsule 3  . loratadine (CLARITIN) 10 MG tablet Take 10 mg by mouth daily.    Marland Kitchen losartan-hydrochlorothiazide (HYZAAR) 100-25 MG tablet TAKE 1 TABLET BY MOUTH ONCE A DAY 90 tablet 3  . meclizine (ANTIVERT) 25 MG tablet Take 1 tablet (25 mg total) by mouth 3 (three) times daily as needed for dizziness.    . meloxicam (MOBIC) 7.5 MG tablet Take 1 tablet (7.5 mg total) by mouth daily as needed for pain. 30 tablet 0  . metoprolol tartrate (LOPRESSOR) 25 MG tablet Take 1 tablet (25 mg total) by mouth 2 (two) times daily. 180 tablet 3  . oxyCODONE (OXY IR/ROXICODONE) 5 MG immediate release tablet Take 5-10 mg by mouth every 4 (four) hours as needed for severe pain. Take  1-2 Tablet every 4 hours for pain. One tablet for moderate pain and two tablets for severe pain.    . polyethylene glycol (MIRALAX / GLYCOLAX) packet Take 17 g by mouth daily.    . sennosides-docusate sodium (SENOKOT-S) 8.6-50 MG tablet Take 1 tablet by mouth 2 (two) times daily.    . travoprost, benzalkonium, (TRAVATAN) 0.004 % ophthalmic solution Place 1 drop into both eyes daily.     . Vitamin D, Cholecalciferol, 1000 units CAPS Take 2,000 Units by mouth daily.      No current facility-administered medications on file prior to visit.    Allergies  Allergen Reactions  . Latex Rash  . Erythromycin Rash  . Sulfonamide Derivatives Rash   Social History   Social History  . Marital status: Married    Spouse name: N/A  . Number of children: N/A  . Years of education: N/A   Occupational History  . Not on file.   Social History Main Topics  . Smoking status: Never Smoker  . Smokeless tobacco: Never Used  . Alcohol use No  . Drug use: No  . Sexual activity: Yes   Other Topics Concern  . Not on file   Social History Narrative  . No narrative on file       Review of Systems  All other systems reviewed and are negative.      Objective:   Physical Exam  Constitutional: She is oriented to person, place, and time. She appears well-developed and well-nourished. No distress.  HENT:  Head: Normocephalic and atraumatic.  Right Ear: External ear normal.  Left Ear: External ear normal.  Nose: Nose normal.  Mouth/Throat: Oropharynx is clear and moist. No oropharyngeal exudate.  Eyes: Conjunctivae and EOM are normal. Pupils are equal, round, and reactive to light.  Neck: Neck supple. No JVD present. No thyromegaly present.  Cardiovascular: Normal rate, regular rhythm and normal heart sounds.   No murmur heard.  Pulmonary/Chest: Effort normal and breath sounds normal.  Abdominal: Soft. Bowel sounds are normal. She exhibits no distension and no mass. There is no tenderness. There is no rebound and no guarding.  Lymphadenopathy:    She has no cervical adenopathy.  Neurological: She is alert and oriented to person, place, and time. She has normal reflexes. No cranial nerve deficit.  Skin: She is not diaphoretic.  Vitals reviewed.         Assessment & Plan:  Hospital discharge follow-up, right zygomatic fracture, right maxillary fracture, left ankle fracture, postconcussive syndrome, vertigo. Overall the patient is doing extremely well considering what she's been through. I recommended she take an aspirin 325 mg daily until she is ambulatory to prevent DVTs. She denies any symptoms of a bladder infection or pneumonia. She is not suffering from constipation. I did recommend in short is a protein supplement. If she continues to have severe headaches on a daily basis in the next month, I will consider starting her on Topamax for prevention of the headaches. I believe that the vertigo is due to trauma likely dislodging the otoliths in the semicircular canals.  At the present time, I do not believe Epley maneuvers on her best interests. As she  improves, if the vertigo persists we can proceed with Epley exercises but for the time being we'll treat the patient symptomatically with meclizine

## 2016-11-20 ENCOUNTER — Ambulatory Visit (INDEPENDENT_AMBULATORY_CARE_PROVIDER_SITE_OTHER): Payer: Medicare HMO | Admitting: Orthopedic Surgery

## 2016-11-20 ENCOUNTER — Ambulatory Visit (INDEPENDENT_AMBULATORY_CARE_PROVIDER_SITE_OTHER): Payer: Medicare HMO

## 2016-11-20 ENCOUNTER — Encounter (INDEPENDENT_AMBULATORY_CARE_PROVIDER_SITE_OTHER): Payer: Self-pay | Admitting: Orthopedic Surgery

## 2016-11-20 VITALS — Ht 69.0 in | Wt 215.0 lb

## 2016-11-20 DIAGNOSIS — S82892D Other fracture of left lower leg, subsequent encounter for closed fracture with routine healing: Secondary | ICD-10-CM | POA: Diagnosis not present

## 2016-11-20 DIAGNOSIS — M79672 Pain in left foot: Secondary | ICD-10-CM

## 2016-11-20 NOTE — Progress Notes (Signed)
Office Visit Note   Patient: Yvonne Rich           Date of Birth: 06/20/1941           MRN: [id number] Visit Date: 11/20/2016              Requested by: Susy Frizzle, MD [id number] [facility] [place], [place] [address] PCP: Odette Fraction, MD  No chief complaint on file.   HPI: Patient is here in follow up for a nondisplaced Weber B fibular fracture left ankle 10/13/16. She is  Non weight bearing in a wheelchair and fracture boot. The pt sates that she has been taking tylenol for pain as needed and does not have any questions today. Pamella Pert, RMA    Assessment & Plan: Visit Diagnoses:  1. Closed fracture of left ankle with routine healing, subsequent encounter   2. Pain in left foot     Plan: Patient will advance weightbearing as tolerated in the fracture boot. She states she has a kneeling scooter and she will have someone check her to make sure she is safe to use a kneeling scooter. She will take the fracture boot off during the day to work on range of motion of the ankle and subtalar joint follow-up in 3 weeks.  Three-view radiographs of the left ankle at follow-up.  Follow-Up Instructions: Return in about 3 weeks (around 12/11/2016).   Ortho Exam On examination patient is alert oriented no adenopathy well-dressed normal affect normal respiratory effort she is ambulating in a wheelchair. She has good pulses she does have some tenderness to palpation over the fibula. Her ankle has pain-free range of motion she has good subtalar motion. She has good pulses there is no ulcerations no cellulitis.  Imaging: Xr Ankle Complete Left  Result Date: 11/20/2016 Three-view radiographs left ankle shows a congruent mortise the fracture site is healing nicely there is no displacement.  Xr Foot Complete Left  Result Date: 11/20/2016 Three-view radiographs the left foot shows no evidence of a fracture noted talar involvement in the ankle fracture.   Orders:  Orders  Placed This Encounter  Procedures  . XR Foot Complete Left  . XR Ankle Complete Left   No orders of the defined types were placed in this encounter.    Procedures: No procedures performed  Clinical Data: No additional findings.  Subjective: Review of Systems  Objective: Vital Signs: Ht 5\' 9"  (1.753 m)   Wt 215 lb (97.5 kg)   BMI 31.75 kg/m   Specialty Comments:  No specialty comments available.  PMFS History: Patient Active Problem List   Diagnosis Date Noted  . Fall 10/17/2016  . Concussion 10/17/2016  . Multiple facial fractures ([place]) 10/17/2016  . Closed displaced fracture of distal phalanx of left great toe 10/17/2016  . Closed left ankle fracture, with routine healing, subsequent encounter 10/13/2016  . Sciatica of right side   . Allergy   . Asthma   . Glaucoma   . OBESITY 09/17/2007  . POLIOMYELITIS 05/24/2007  . HYPERLIPIDEMIA 05/24/2007  . Essential hypertension 05/24/2007   Past Medical History:  Diagnosis Date  . Allergy   . Asthma   . CKD (chronic kidney disease) stage 3, GFR 30-59 ml/min   . Glaucoma   . Hypertension   . Poliomyelitis   . Sciatica of right side     Family History  Problem Relation Age of Onset  . Hypertension Father   . Vision loss Father  Past Surgical History:  Procedure Laterality Date  . CHOLECYSTECTOMY  2002  . COSMETIC SURGERY  2012   eye lids  . [place]   Social History   Occupational History  . Not on file.   Social History Main Topics  . Smoking status: Never Smoker  . Smokeless tobacco: Never Used  . Alcohol use No  . Drug use: No  . Sexual activity: Yes

## 2016-12-08 NOTE — Progress Notes (Signed)
Office Visit Note   Patient: Yvonne Rich           Date of Birth: [birth year]           MRN: [id number] Visit Date: 12/11/2016              Requested by: Susy Frizzle, MD [address] [facility] [place], [place] [address] PCP: Odette Fraction, MD  Chief Complaint  Patient presents with  . Left Ankle - Follow-up     nondisplaced Weber B fibular fracture left ankle 10/13/16    HPI: Patient is a 76 year old woman here in follow up for a nondisplaced Weber B fibular fracture left ankle 10/13/16. She is weight bearing with a fracture boot and a walker. She states that she is feeling well, she is not taking anything for pain and that she does not have any questions or concerns today.   Daughter accompanies. States she has not been walking 'right' in the boot. Minimal pain with ambulation.     Assessment & Plan: Visit Diagnoses:  1. Pain in left ankle and joints of left foot   2. Closed left ankle fracture, with routine healing, subsequent encounter   3. Closed displaced fracture of distal phalanx of left great toe with routine healing, subsequent encounter     Plan: Advance weight bearing in regular shoe wear. Recommended stiff soled shoes. Have discussed option for PT as patient has not been doing well ambulating in boot. Encouraged her to work on rom left ankle. Follow up in 4 weeks.   Follow-Up Instructions: Return in about 4 weeks (around 01/08/2017).   Left Ankle Exam  Swelling: none  Tenderness  The patient is experiencing tenderness in the lateral malleolus and deltoid.   Tests  Anterior drawer: negative Varus tilt: negative  Comments:  Great toe PIP mildly tender       Imaging: Xr Ankle Complete Left  Result Date: 12/11/2016 Three-view radiographs of the left ankle show a congruent mortise. the fracture site is healing nicely there is no displacement. Great toe proximal phalanx fracture is well callused.    Labs: No results found for: HGBA1C,  ESRSEDRATE, CRP, LABURIC, REPTSTATUS, GRAMSTAIN, CULT, LABORGA  Orders:  Orders Placed This Encounter  Procedures  . XR Ankle Complete Left   No orders of the defined types were placed in this encounter.    Procedures: No procedures performed  Clinical Data: No additional findings.  Subjective: Review of Systems  Objective: Vital Signs: Ht 5\' 9"  (1.753 m)   Wt 215 lb (97.5 kg)   BMI 31.75 kg/m   Specialty Comments:  No specialty comments available.  PMFS History: Patient Active Problem List   Diagnosis Date Noted  . Fall 10/17/2016  . Concussion 10/17/2016  . Multiple facial fractures ([place]) 10/17/2016  . Closed displaced fracture of distal phalanx of left great toe 10/17/2016  . Closed left ankle fracture, with routine healing, subsequent encounter 10/13/2016  . Sciatica of right side   . Allergy   . Asthma   . Glaucoma   . OBESITY 09/17/2007  . POLIOMYELITIS 05/24/2007  . HYPERLIPIDEMIA 05/24/2007  . Essential hypertension 05/24/2007   Past Medical History:  Diagnosis Date  . Allergy   . Asthma   . CKD (chronic kidney disease) stage 3, GFR 30-59 ml/min   . Glaucoma   . Hypertension   . Poliomyelitis   . Sciatica of right side     Family History  Problem Relation Age  of Onset  . Hypertension Father   . Vision loss Father     Past Surgical History:  Procedure Laterality Date  . CHOLECYSTECTOMY  2002  . COSMETIC SURGERY  2012   eye lids  . [place]   Social History   Occupational History  . Not on file.   Social History Main Topics  . Smoking status: Never Smoker  . Smokeless tobacco: Never Used  . Alcohol use No  . Drug use: No  . Sexual activity: Yes

## 2016-12-11 ENCOUNTER — Ambulatory Visit (INDEPENDENT_AMBULATORY_CARE_PROVIDER_SITE_OTHER): Payer: Medicare HMO

## 2016-12-11 ENCOUNTER — Ambulatory Visit (INDEPENDENT_AMBULATORY_CARE_PROVIDER_SITE_OTHER): Payer: Medicare HMO | Admitting: Orthopedic Surgery

## 2016-12-11 ENCOUNTER — Encounter (INDEPENDENT_AMBULATORY_CARE_PROVIDER_SITE_OTHER): Payer: Self-pay | Admitting: Orthopedic Surgery

## 2016-12-11 VITALS — Ht 69.0 in | Wt 215.0 lb

## 2016-12-11 DIAGNOSIS — M25572 Pain in left ankle and joints of left foot: Secondary | ICD-10-CM | POA: Diagnosis not present

## 2016-12-11 DIAGNOSIS — S82892D Other fracture of left lower leg, subsequent encounter for closed fracture with routine healing: Secondary | ICD-10-CM

## 2016-12-11 DIAGNOSIS — S92422D Displaced fracture of distal phalanx of left great toe, subsequent encounter for fracture with routine healing: Secondary | ICD-10-CM

## 2016-12-15 ENCOUNTER — Other Ambulatory Visit: Payer: Self-pay | Admitting: Family Medicine

## 2016-12-15 MED ORDER — MECLIZINE HCL 25 MG PO TABS: 25.0000 mg | ORAL_TABLET | Freq: Three times a day (TID) | ORAL | 1 refills | Status: DC | PRN

## 2017-01-03 ENCOUNTER — Other Ambulatory Visit: Payer: Self-pay | Admitting: Family Medicine

## 2017-01-09 ENCOUNTER — Encounter (INDEPENDENT_AMBULATORY_CARE_PROVIDER_SITE_OTHER): Payer: Self-pay | Admitting: Orthopedic Surgery

## 2017-01-09 ENCOUNTER — Ambulatory Visit (INDEPENDENT_AMBULATORY_CARE_PROVIDER_SITE_OTHER): Payer: Medicare HMO | Admitting: Orthopedic Surgery

## 2017-01-09 VITALS — Ht 69.0 in | Wt 215.0 lb

## 2017-01-09 DIAGNOSIS — S82892D Other fracture of left lower leg, subsequent encounter for closed fracture with routine healing: Secondary | ICD-10-CM

## 2017-01-10 NOTE — Progress Notes (Signed)
Office Visit Note   Patient: Yvonne Rich           Date of Birth: 08-Apr-1941           MRN: [id number] Visit Date: 01/09/2017              Requested by: Susy Frizzle, MD [address] [facility] [place], [place] [address] PCP: Odette Fraction, MD  Chief Complaint  Patient presents with  . Left Ankle - Follow-up    Nondisplaced weber B fib fracture.      HPI: Patient is status post nondisplaced Weber B fibular fracture left ankle. She is approximately 3 months out from her injury. She is currently full weightbearing in regular shoewear she states she does have some soreness and swelling with increased activities.  Assessment & Plan: Visit Diagnoses:  1. Closed left ankle fracture, with routine healing, subsequent encounter   Stable healed nondisplaced left fibular fracture  Plan: Continue increase her activities as tolerated discussed that she may benefit from an ankle stabilizing orthosis. Work on range of motion ankle with dorsiflexion of the ankle.  Follow-Up Instructions: Return if symptoms worsen or fail to improve.   Ortho Exam  Patient is alert, oriented, no adenopathy, well-dressed, normal affect, normal respiratory effort. Examination patient does have an antalgic gait she has palpable pulses she has pain-free range of motion of the ankle passively the fibula is nontender to palpation anterior drawer stable. There is no redness no cellulitis no swelling no ulceration.  Imaging: No results found.  Labs: No results found for: HGBA1C, ESRSEDRATE, CRP, LABURIC, REPTSTATUS, GRAMSTAIN, CULT, LABORGA  Orders:  No orders of the defined types were placed in this encounter.  No orders of the defined types were placed in this encounter.    Procedures: No procedures performed  Clinical Data: No additional findings.  ROS:  All other systems negative, except as noted in the HPI. Review of Systems  Objective: Vital Signs: Ht 5\' 9"  (1.753 m)   Wt 215 lb  (97.5 kg)   BMI 31.75 kg/m   Specialty Comments:  No specialty comments available.  PMFS History: Patient Active Problem List   Diagnosis Date Noted  . Fall 10/17/2016  . Concussion 10/17/2016  . Multiple facial fractures ([place]) 10/17/2016  . Closed displaced fracture of distal phalanx of left great toe 10/17/2016  . Closed left ankle fracture, with routine healing, subsequent encounter 10/13/2016  . Sciatica of right side   . Allergy   . Asthma   . Glaucoma   . OBESITY 09/17/2007  . POLIOMYELITIS 05/24/2007  . HYPERLIPIDEMIA 05/24/2007  . Essential hypertension 05/24/2007   Past Medical History:  Diagnosis Date  . Allergy   . Asthma   . CKD (chronic kidney disease) stage 3, GFR 30-59 ml/min   . Glaucoma   . Hypertension   . Poliomyelitis   . Sciatica of right side     Family History  Problem Relation Age of Onset  . Hypertension Father   . Vision loss Father     Past Surgical History:  Procedure Laterality Date  . CHOLECYSTECTOMY  2002  . COSMETIC SURGERY  2012   eye lids  . [place]   Social History   Occupational History  . Not on file.   Social History Main Topics  . Smoking status: Never Smoker  . Smokeless tobacco: Never Used  . Alcohol use No  . Drug use: No  . Sexual activity: Yes

## 2017-05-09 ENCOUNTER — Other Ambulatory Visit: Payer: Self-pay | Admitting: Family Medicine

## 2017-06-27 ENCOUNTER — Other Ambulatory Visit: Payer: Self-pay | Admitting: Family

## 2017-06-27 ENCOUNTER — Telehealth: Payer: Self-pay

## 2017-06-27 NOTE — Telephone Encounter (Signed)
Rx request [place] 0.004 % Last OV 01/09/2017 Next OV: NONE Last refilled unknown historical summary  Please advise

## 2017-06-28 MED ORDER — TRAVOPROST 0.004 % OP SOLN: 1.0000 [drp] | Freq: Every day | OPHTHALMIC | 0 refills | Status: DC

## 2017-06-28 NOTE — Addendum Note (Signed)
Addended by: Sheral Flow on: 06/28/2017 01:47 PM   Modules accepted: Orders

## 2017-06-28 NOTE — Telephone Encounter (Signed)
These are drops to treat glaucoma and need to come from her opthalmologist.  Can have a temporary 1 month supply until they can contact her eye dr.

## 2017-06-28 NOTE — Telephone Encounter (Signed)
Prescription sent to pharmacy.   Call placed to patient and patient made aware per VM. 

## 2017-09-20 ENCOUNTER — Other Ambulatory Visit: Payer: Self-pay | Admitting: Family Medicine

## 2017-11-16 ENCOUNTER — Encounter: Payer: Self-pay | Admitting: Family Medicine

## 2017-11-16 ENCOUNTER — Ambulatory Visit (INDEPENDENT_AMBULATORY_CARE_PROVIDER_SITE_OTHER): Payer: Medicare HMO | Admitting: Family Medicine

## 2017-11-16 VITALS — BP 122/80 | HR 66 | Temp 98.2°F | Resp 16 | Ht 69.0 in | Wt 209.0 lb

## 2017-11-16 DIAGNOSIS — F0781 Postconcussional syndrome: Secondary | ICD-10-CM

## 2017-11-16 DIAGNOSIS — Z1231 Encounter for screening mammogram for malignant neoplasm of breast: Secondary | ICD-10-CM

## 2017-11-16 DIAGNOSIS — Z23 Encounter for immunization: Secondary | ICD-10-CM | POA: Diagnosis not present

## 2017-11-16 DIAGNOSIS — Z0000 Encounter for general adult medical examination without abnormal findings: Secondary | ICD-10-CM | POA: Diagnosis not present

## 2017-11-16 DIAGNOSIS — R69 Illness, unspecified: Secondary | ICD-10-CM | POA: Diagnosis not present

## 2017-11-16 DIAGNOSIS — Z1239 Encounter for other screening for malignant neoplasm of breast: Secondary | ICD-10-CM

## 2017-11-16 MED ORDER — MELOXICAM 7.5 MG PO TABS: 7.5000 mg | ORAL_TABLET | Freq: Every day | ORAL | 0 refills | Status: DC | PRN

## 2017-11-16 MED ORDER — TOPIRAMATE 25 MG PO TABS: 25.0000 mg | ORAL_TABLET | Freq: Two times a day (BID) | ORAL | 1 refills | Status: DC

## 2017-11-16 NOTE — Progress Notes (Signed)
Subjective:    Patient ID: Yvonne Rich, female    DOB: 09/08/1941, 77 y.o.   MRN: [id number]  HPI Unfortunately, the patient was recently admitted to the hospital after a fall. I have copied relevant portions of the discharge summary and included them below for my reference:  Admit date: 10/13/2016 Discharge date: 10/17/2016  Discharge Diagnoses     Patient Active Problem List   Diagnosis Date Noted  . Fall 10/17/2016  . Concussion 10/17/2016  . Multiple facial fractures ([place]) 10/17/2016  . Closed displaced fracture of distal phalanx of left great toe 10/17/2016  . Closed fracture of left ankle 10/13/2016  . Sciatica of right side   . Allergy   . Asthma   . Glaucoma   . OBESITY 09/17/2007  . POLIOMYELITIS 05/24/2007  . HYPERLIPIDEMIA 05/24/2007  . HYPERTENSION 05/24/2007    Consultants Dr. Meridee Score for orthopedic surgery  Dr. Jodi Marble for ENT   Procedures 1/5 -- Repair of scalp laceration by Domenic Moras, PA-C   HPI: Marlia [facility] via EMS after a fall in her basement. She was initially amnestic to the event and did not remember how she fell. She reported falling from the stairs in her basement onto the concrete floor after helping her dog out of a crawl space. She reported a loss of consciousness for an unknown amount of time before going upstairs to wake up her husband for help. Her workup included CT scans of the head and cervical spine as well as extremity x-rays which showed the above-mentioned injuries. She was admitted to the trauma service and orthopedic surgery and ENT were consulted. A small scalp laceration was closed in the ED.   Hospital Course: Both orthopedic surgery and ENT recommended non-operative treatment for her fractures. Her pain was controlled on oral medications. She was mobilized with physical and occupational therapies who recommended skilled nursing facility placement. She had some post-concussive symptoms such  as dizziness that were managed symptomatically. She was discharged to the facility in good condition.  11/2016 Patient is here today coming by her daughter. She now has more memories of the incident. She is trying to pull heard all from under a step when she lost her balance falling and striking her head on the floor. There was no syncope. Since discharge from the hospital she continues to have daily headaches. Originally they were in 8 or 9 on a scale of 10. Now they're down to 4. So they are gradually improving. She also has daily vertigo triggered by position changes. This is slowly improving. Her appetite is starting to improve. She denies any cough. She denies any shortness of breath. There is no pitting edema in her left leg which is early in a Cam Walker due to her ankle fracture. There is no evidence of a DVT. She denies symptoms of PE. She denies any dysuria. She denies any constipation. She is treating this with prune juice and plenty of fluids. Overall she is doing relatively well. She follows up with orthopedist next week. The ENT doctor has determined that her facial fractures required no surgery.  At that time, my plan was: Hospital discharge follow-up, right zygomatic fracture, right maxillary fracture, left ankle fracture, postconcussive syndrome, vertigo. Overall the patient is doing extremely well considering what she's been through. I recommended she take an aspirin 325 mg daily until she is ambulatory to prevent DVTs. She denies any symptoms of a bladder infection or pneumonia. She is not suffering from  constipation. I did recommend in short is a protein supplement. If she continues to have severe headaches on a daily basis in the next month, I will consider starting her on Topamax for prevention of the headaches. I believe that the vertigo is due to trauma likely dislodging the otoliths in the semicircular canals.  At the present time, I do not believe Epley maneuvers on her best interests.  As she improves, if the vertigo persists we can proceed with Epley exercises but for the time being we'll treat the patient symptomatically with meclizine  11/16/17 Here for CPE.  Still having chronic daily headaches 1 year later!.  Located in the frontal sinus areaea.  Dull constant pressure.  Patient states that she is having to take Tylenol on a daily basis to ease her headaches.  Occasionally the headaches will progress into migraines with photophobia and nausea.  She is due for a mammogram.  Her last colonoscopy was at age 65 and is due again at age 5.  Based on her age, she does not require Pap smear.  Her immunizations are up-to-date except for Prevnar 13, and the flu shot.  She is willing to get the flu shot today.  She refuses Prevnar 13.  We discussed the shingles vaccine and she defers that at the present time.  She is due for fasting lab work Past Medical History:  Diagnosis Date  . Allergy   . Asthma   . CKD (chronic kidney disease) stage 3, GFR 30-59 ml/min ([facility])   . Glaucoma   . Hypertension   . Poliomyelitis   . Sciatica of right side    Past Surgical History:  Procedure Laterality Date  . CHOLECYSTECTOMY  2002  . COSMETIC SURGERY  2012   eye lids  . ROTATOR CUFF REPAIR  1994   Current Outpatient Medications on File Prior to Visit  Medication Sig Dispense Refill  . acetaminophen (TYLENOL) 500 MG tablet Take 1,000 mg by mouth every 6 (six) hours as needed for mild pain.    Marland Kitchen albuterol (PROAIR HFA) 108 (90 BASE) MCG/ACT inhaler Inhale 2 puffs into the lungs every 6 (six) hours as needed for wheezing or shortness of breath. 1 Inhaler 1  . aspirin 81 MG tablet Take 81 mg by mouth daily.    Marland Kitchen docusate sodium (COLACE) 100 MG capsule Take 1 capsule (100 mg total) by mouth 2 (two) times daily. (Patient taking differently: Take 100 mg by mouth 2 (two) times daily as needed. )    . gabapentin (NEURONTIN) 300 MG capsule Take 1 capsule (300 mg total) by mouth 3 (three) times daily.  (Patient taking differently: Take 300 mg by mouth 3 (three) times daily as needed. ) 90 capsule 3  . loratadine (CLARITIN) 10 MG tablet Take 10 mg by mouth daily.    Marland Kitchen losartan-hydrochlorothiazide (HYZAAR) 100-25 MG tablet TAKE 1 TABLET BY MOUTH ONCE DAILY 90 tablet 3  . meclizine (ANTIVERT) 25 MG tablet Take 1 tablet (25 mg total) by mouth 3 (three) times daily as needed for dizziness. 30 tablet 1  . metoprolol tartrate (LOPRESSOR) 25 MG tablet TAKE 1 TABLET BY MOUTH TWICE A DAY 180 tablet 3  . oxyCODONE (OXY IR/ROXICODONE) 5 MG immediate release tablet Take 5-10 mg by mouth every 4 (four) hours as needed for severe pain. Take  1-2 Tablet every 4 hours for pain. One tablet for moderate pain and two tablets for severe pain.    . polyethylene glycol (MIRALAX / GLYCOLAX) packet Take  17 g by mouth daily. (Patient taking differently: Take 17 g by mouth daily as needed. )    . sennosides-docusate sodium (SENOKOT-S) 8.6-50 MG tablet Take 1 tablet by mouth 2 (two) times daily as needed.     . travoprost, benzalkonium, (TRAVATAN) 0.004 % ophthalmic solution Place 1 drop into both eyes daily. 2.5 mL 0  . Vitamin D, Cholecalciferol, 1000 units CAPS Take 2,000 Units by mouth daily.      No current facility-administered medications on file prior to visit.    Allergies  Allergen Reactions  . Latex Rash  . Erythromycin Rash  . Sulfonamide Derivatives Rash   Social History   Socioeconomic History  . Marital status: Married    Spouse name: Not on file  . Number of children: Not on file  . Years of education: Not on file  . Highest education level: Not on file  Social Needs  . Financial resource strain: Not on file  . Food insecurity - worry: Not on file  . Food insecurity - inability: Not on file  . Transportation needs - medical: Not on file  . Transportation needs - non-medical: Not on file  Occupational History  . Not on file  Tobacco Use  . Smoking status: Never Smoker  . Smokeless tobacco:  Never Used  Substance and Sexual Activity  . Alcohol use: No  . Drug use: No  . Sexual activity: Yes  Other Topics Concern  . Not on file  Social History Narrative  . Not on file      Review of Systems  All other systems reviewed and are negative.      Objective:   Physical Exam  Constitutional: She is oriented to person, place, and time. She appears well-developed and well-nourished. No distress.  HENT:  Head: Normocephalic and atraumatic.  Right Ear: External ear normal.  Left Ear: External ear normal.  Nose: Nose normal.  Mouth/Throat: Oropharynx is clear and moist. No oropharyngeal exudate.  Eyes: Conjunctivae and EOM are normal. Pupils are equal, round, and reactive to light.  Neck: Neck supple. No JVD present. No thyromegaly present.  Cardiovascular: Normal rate, regular rhythm and normal heart sounds.  No murmur heard. Pulmonary/Chest: Effort normal and breath sounds normal.  Abdominal: Soft. Bowel sounds are normal. She exhibits no distension and no mass. There is no tenderness. There is no rebound and no guarding.  Lymphadenopathy:    She has no cervical adenopathy.  Neurological: She is alert and oriented to person, place, and time. She has normal reflexes. No cranial nerve deficit.  Skin: She is not diaphoretic.  Vitals reviewed.         Assessment & Plan:  Needs flu shot - Plan: Flu Vaccine QUAD 36+ mos IM  General medical exam  Post concussion syndrome  Breast cancer screening  Patient is interested in medication to try to prevent and stop her headaches.  Begin Topamax 25 mg p.o. nightly.  Increase by 25 mg every week so that by week 4, the patient is taking 50 mg twice daily.  Reassess in 6 weeks to see if headaches are improving.  Return fasting for a CBC, CMP, fasting lipid panel.  Patient received her flu shot today in clinic.  She declined Prevnar 13.  She declined the shingles vaccine.  I will schedule her for mammogram.  Colonoscopy is  up-to-date.  She does not require Pap smear.  Reassess in 6 weeks

## 2017-11-23 ENCOUNTER — Other Ambulatory Visit: Payer: Self-pay | Admitting: Family Medicine

## 2017-11-23 DIAGNOSIS — Z1231 Encounter for screening mammogram for malignant neoplasm of breast: Secondary | ICD-10-CM

## 2017-12-14 ENCOUNTER — Ambulatory Visit
Admission: RE | Admit: 2017-12-14 | Discharge: 2017-12-14 | Disposition: A | Discharge status: Home / Self Care | Payer: Medicare HMO | Source: Ambulatory Visit | Attending: Family Medicine | Admitting: Family Medicine

## 2017-12-14 ENCOUNTER — Encounter: Payer: Self-pay | Admitting: Radiology

## 2017-12-14 DIAGNOSIS — Z1231 Encounter for screening mammogram for malignant neoplasm of breast: Secondary | ICD-10-CM

## 2017-12-28 ENCOUNTER — Ambulatory Visit (INDEPENDENT_AMBULATORY_CARE_PROVIDER_SITE_OTHER): Payer: Medicare HMO | Admitting: Family Medicine

## 2017-12-28 ENCOUNTER — Encounter: Payer: Self-pay | Admitting: Family Medicine

## 2017-12-28 VITALS — BP 146/78 | HR 76 | Temp 98.2°F | Resp 16 | Ht 69.0 in | Wt 218.0 lb

## 2017-12-28 DIAGNOSIS — B9689 Other specified bacterial agents as the cause of diseases classified elsewhere: Secondary | ICD-10-CM

## 2017-12-28 DIAGNOSIS — Z23 Encounter for immunization: Secondary | ICD-10-CM

## 2017-12-28 DIAGNOSIS — J019 Acute sinusitis, unspecified: Secondary | ICD-10-CM | POA: Diagnosis not present

## 2017-12-28 MED ORDER — AMOXICILLIN-POT CLAVULANATE 875-125 MG PO TABS: 1.0000 | ORAL_TABLET | Freq: Two times a day (BID) | ORAL | 0 refills | Status: DC

## 2017-12-28 NOTE — Progress Notes (Signed)
Subjective:    Patient ID: Yvonne Rich, female    DOB: [birth year], 77 y.o.   MRN: [id number]  HPI Unfortunately, the patient was recently admitted to the hospital after a fall. I have copied relevant portions of the discharge summary and included them below for my reference:  Admit date: 10/13/2016 Discharge date: 10/17/2016  Discharge Diagnoses     Patient Active Problem List   Diagnosis Date Noted  . Fall 10/17/2016  . Concussion 10/17/2016  . Multiple facial fractures ([place]) 10/17/2016  . Closed displaced fracture of distal phalanx of left great toe 10/17/2016  . Closed fracture of left ankle 10/13/2016  . Sciatica of right side   . Allergy   . Asthma   . Glaucoma   . OBESITY 09/17/2007  . POLIOMYELITIS 05/24/2007  . HYPERLIPIDEMIA 05/24/2007  . HYPERTENSION 05/24/2007    Consultants Dr. Meridee Score for orthopedic surgery  Dr. Jodi Marble for ENT   Procedures 1/5 -- Repair of scalp laceration by Domenic Moras, PA-C   HPI: Bliss presents to [facility] via EMS after a fall in her basement. She was initially amnestic to the event and did not remember how she fell. She reported falling from the stairs in her basement onto the concrete floor after helping her dog out of a crawl space. She reported a loss of consciousness for an unknown amount of time before going upstairs to wake up her husband for help. Her workup included CT scans of the head and cervical spine as well as extremity x-rays which showed the above-mentioned injuries. She was admitted to the trauma service and orthopedic surgery and ENT were consulted. A small scalp laceration was closed in the ED.   Hospital Course: Both orthopedic surgery and ENT recommended non-operative treatment for her fractures. Her pain was controlled on oral medications. She was mobilized with physical and occupational therapies who recommended skilled nursing facility placement. She had some post-concussive symptoms such  as dizziness that were managed symptomatically. She was discharged to the facility in good condition.  11/2016 Patient is here today coming by her daughter. She now has more memories of the incident. She is trying to pull heard all from under a step when she lost her balance falling and striking her head on the floor. There was no syncope. Since discharge from the hospital she continues to have daily headaches. Originally they were in 8 or 9 on a scale of 10. Now they're down to 4. So they are gradually improving. She also has daily vertigo triggered by position changes. This is slowly improving. Her appetite is starting to improve. She denies any cough. She denies any shortness of breath. There is no pitting edema in her left leg which is early in a [place] due to her ankle fracture. There is no evidence of a DVT. She denies symptoms of PE. She denies any dysuria. She denies any constipation. She is treating this with prune juice and plenty of fluids. Overall she is doing relatively well. She follows up with orthopedist next week. The ENT doctor has determined that her facial fractures required no surgery.  At that time, my plan was: Hospital discharge follow-up, right zygomatic fracture, right maxillary fracture, left ankle fracture, postconcussive syndrome, vertigo. Overall the patient is doing extremely well considering what she's been through. I recommended she take an aspirin 325 mg daily until she is ambulatory to prevent DVTs. She denies any symptoms of a bladder infection or pneumonia. She is not suffering from  constipation. I did recommend in short is a protein supplement. If she continues to have severe headaches on a daily basis in the next month, I will consider starting her on Topamax for prevention of the headaches. I believe that the vertigo is due to trauma likely dislodging the otoliths in the semicircular canals.  At the present time, I do not believe Epley maneuvers on her best interests.  As she improves, if the vertigo persists we can proceed with Epley exercises but for the time being we'll treat the patient symptomatically with meclizine  11/16/17 Here for CPE.  Still having chronic daily headaches 1 year later!.  Located in the frontal sinus areaea.  Dull constant pressure.  Patient states that she is having to take Tylenol on a daily basis to ease her headaches.  Occasionally the headaches will progress into migraines with photophobia and nausea.  She is due for a mammogram.  Her last colonoscopy was at age 69 and is due again at age 15.  Based on her age, she does not require Pap smear.  Her immunizations are up-to-date except for Prevnar 13, and the flu shot.  She is willing to get the flu shot today.  She refuses Prevnar 13.  We discussed the shingles vaccine and she defers that at the present time.  She is due for fasting lab work.  At that time, my plan was: Patient is interested in medication to try to prevent and stop her headaches.  Begin Topamax 25 mg p.o. nightly.  Increase by 25 mg every week so that by week 4, the patient is taking 50 mg twice daily.  Reassess in 6 weeks to see if headaches are improving.  Return fasting for a CBC, CMP, fasting lipid panel.  Patient received her flu shot today in clinic.  She declined Prevnar 13.  She declined the shingles vaccine.  I will schedule her for mammogram.  Colonoscopy is up-to-date.  She does not require Pap smear.  Reassess in 6 weeks+   12/28/17 Patient felt terrible on topamax.  She felt groggy and dizzy and stopped the medicine after 2.5 weeks.  She did not see any improvement in her headaches.  However, she unilaterally started benadryl and alleve and allegra due to the location of her primary pain.  The pain was mainly over her right eye.  Also in her right cheek.  She had been experiencing green nasal drainage and sinus pressure.  She also reports sneezing.  On the new meds, the headaches have improved.  She is now complaining  or persistent pain albeit better in the right frontal and right maxillary sinus area and green nasal drainage.  Also had occasional epistaxis. Past Medical History:  Diagnosis Date  . Allergy   . Asthma   . CKD (chronic kidney disease) stage 3, GFR 30-59 ml/min (HCC)   . Glaucoma   . Hypertension   . Poliomyelitis   . Sciatica of right side    Past Surgical History:  Procedure Laterality Date  . CHOLECYSTECTOMY  2002  . COSMETIC SURGERY  2012   eye lids  . ROTATOR CUFF REPAIR  1994   Current Outpatient Medications on File Prior to Visit  Medication Sig Dispense Refill  . acetaminophen (TYLENOL) 500 MG tablet Take 1,000 mg by mouth every 6 (six) hours as needed for mild pain.    Marland Kitchen albuterol (PROAIR HFA) 108 (90 BASE) MCG/ACT inhaler Inhale 2 puffs into the lungs every 6 (six) hours as needed for  wheezing or shortness of breath. 1 Inhaler 1  . aspirin 81 MG tablet Take 81 mg by mouth daily.    Marland Kitchen docusate sodium (COLACE) 100 MG capsule Take 1 capsule (100 mg total) by mouth 2 (two) times daily. (Patient taking differently: Take 100 mg by mouth 2 (two) times daily as needed. )    . gabapentin (NEURONTIN) 300 MG capsule Take 1 capsule (300 mg total) by mouth 3 (three) times daily. (Patient taking differently: Take 300 mg by mouth 3 (three) times daily as needed. ) 90 capsule 3  . loratadine (CLARITIN) 10 MG tablet Take 10 mg by mouth daily.    Marland Kitchen losartan-hydrochlorothiazide (HYZAAR) 100-25 MG tablet TAKE 1 TABLET BY MOUTH ONCE DAILY 90 tablet 3  . meclizine (ANTIVERT) 25 MG tablet Take 1 tablet (25 mg total) by mouth 3 (three) times daily as needed for dizziness. 30 tablet 1  . meloxicam (MOBIC) 7.5 MG tablet Take 1 tablet (7.5 mg total) by mouth daily as needed for pain. 30 tablet 0  . metoprolol tartrate (LOPRESSOR) 25 MG tablet TAKE 1 TABLET BY MOUTH TWICE A DAY 180 tablet 3  . sennosides-docusate sodium (SENOKOT-S) 8.6-50 MG tablet Take 1 tablet by mouth 2 (two) times daily as needed.      . travoprost, benzalkonium, (TRAVATAN) 0.004 % ophthalmic solution Place 1 drop into both eyes daily. 2.5 mL 0  . Vitamin D, Cholecalciferol, 1000 units CAPS Take 2,000 Units by mouth daily.      No current facility-administered medications on file prior to visit.    Allergies  Allergen Reactions  . Latex Rash  . Erythromycin Rash  . Sulfonamide Derivatives Rash   Social History   Socioeconomic History  . Marital status: Married    Spouse name: Not on file  . Number of children: Not on file  . Years of education: Not on file  . Highest education level: Not on file  Occupational History  . Not on file  Social Needs  . Financial resource strain: Not on file  . Food insecurity:    Worry: Not on file    Inability: Not on file  . Transportation needs:    Medical: Not on file    Non-medical: Not on file  Tobacco Use  . Smoking status: Never Smoker  . Smokeless tobacco: Never Used  Substance and Sexual Activity  . Alcohol use: No  . Drug use: No  . Sexual activity: Yes  Lifestyle  . Physical activity:    Days per week: Not on file    Minutes per session: Not on file  . Stress: Not on file  Relationships  . Social connections:    Talks on phone: Not on file    Gets together: Not on file    Attends religious service: Not on file    Active member of club or organization: Not on file    Attends meetings of clubs or organizations: Not on file    Relationship status: Not on file  . Intimate partner violence:    Fear of current or ex partner: Not on file    Emotionally abused: Not on file    Physically abused: Not on file    Forced sexual activity: Not on file  Other Topics Concern  . Not on file  Social History Narrative  . Not on file      Review of Systems  All other systems reviewed and are negative.      Objective:   Physical  Exam  Constitutional: She is oriented to person, place, and time. She appears well-developed and well-nourished. No distress.    HENT:  Head: Normocephalic and atraumatic.  Right Ear: External ear normal.  Left Ear: External ear normal.  Nose: Mucosal edema and rhinorrhea present. Right sinus exhibits maxillary sinus tenderness and frontal sinus tenderness.  Mouth/Throat: Oropharynx is clear and moist. No oropharyngeal exudate.  Eyes: Pupils are equal, round, and reactive to light. Conjunctivae and EOM are normal.  Neck: Neck supple. No JVD present. No thyromegaly present.  Cardiovascular: Normal rate, regular rhythm and normal heart sounds.  No murmur heard. Pulmonary/Chest: Effort normal and breath sounds normal.  Abdominal: Soft. Bowel sounds are normal. She exhibits no distension and no mass. There is no tenderness. There is no rebound and no guarding.  Lymphadenopathy:    She has no cervical adenopathy.  Neurological: She is alert and oriented to person, place, and time. She has normal reflexes. No cranial nerve deficit.  Skin: She is not diaphoretic.  Vitals reviewed.         Assessment & Plan:  Acute bacterial rhinosinusitis - Plan: amoxicillin-clavulanate (AUGMENTIN) 875-125 MG tablet  Will try augmentin 875 mg pobid for 10 days with alleve and flonase. For possible underlying sinusitis.  Consider imaging of the sinuses if no better.

## 2017-12-28 NOTE — Addendum Note (Signed)
Addended by: Shary Decamp B on: 12/28/2017 05:16 PM   Modules accepted: Orders

## 2018-01-03 DIAGNOSIS — H02403 Unspecified ptosis of bilateral eyelids: Secondary | ICD-10-CM | POA: Diagnosis not present

## 2018-01-03 DIAGNOSIS — H04123 Dry eye syndrome of bilateral lacrimal glands: Secondary | ICD-10-CM | POA: Diagnosis not present

## 2018-01-03 DIAGNOSIS — H40023 Open angle with borderline findings, high risk, bilateral: Secondary | ICD-10-CM | POA: Diagnosis not present

## 2018-03-18 DIAGNOSIS — H43813 Vitreous degeneration, bilateral: Secondary | ICD-10-CM | POA: Diagnosis not present

## 2018-03-18 DIAGNOSIS — H26493 Other secondary cataract, bilateral: Secondary | ICD-10-CM | POA: Diagnosis not present

## 2018-03-18 DIAGNOSIS — H35361 Drusen (degenerative) of macula, right eye: Secondary | ICD-10-CM | POA: Diagnosis not present

## 2018-03-18 DIAGNOSIS — H40023 Open angle with borderline findings, high risk, bilateral: Secondary | ICD-10-CM | POA: Diagnosis not present

## 2018-05-13 DIAGNOSIS — H1851 Endothelial corneal dystrophy: Secondary | ICD-10-CM | POA: Diagnosis not present

## 2018-05-13 DIAGNOSIS — H40023 Open angle with borderline findings, high risk, bilateral: Secondary | ICD-10-CM | POA: Diagnosis not present

## 2018-05-13 DIAGNOSIS — H01009 Unspecified blepharitis unspecified eye, unspecified eyelid: Secondary | ICD-10-CM | POA: Diagnosis not present

## 2018-05-13 DIAGNOSIS — H16223 Keratoconjunctivitis sicca, not specified as Sjogren's, bilateral: Secondary | ICD-10-CM | POA: Diagnosis not present

## 2018-06-14 ENCOUNTER — Other Ambulatory Visit: Payer: Self-pay | Admitting: Family Medicine

## 2018-06-27 ENCOUNTER — Other Ambulatory Visit: Payer: Self-pay | Admitting: Family Medicine

## 2018-09-30 ENCOUNTER — Other Ambulatory Visit: Payer: Self-pay | Admitting: Family Medicine

## 2018-10-15 DIAGNOSIS — H01009 Unspecified blepharitis unspecified eye, unspecified eyelid: Secondary | ICD-10-CM | POA: Diagnosis not present

## 2018-10-15 DIAGNOSIS — H40023 Open angle with borderline findings, high risk, bilateral: Secondary | ICD-10-CM | POA: Diagnosis not present

## 2018-10-15 DIAGNOSIS — H1851 Endothelial corneal dystrophy: Secondary | ICD-10-CM | POA: Diagnosis not present

## 2018-10-15 DIAGNOSIS — H16223 Keratoconjunctivitis sicca, not specified as Sjogren's, bilateral: Secondary | ICD-10-CM | POA: Diagnosis not present

## 2018-10-21 ENCOUNTER — Ambulatory Visit (INDEPENDENT_AMBULATORY_CARE_PROVIDER_SITE_OTHER): Payer: Medicare HMO

## 2018-10-21 DIAGNOSIS — Z23 Encounter for immunization: Secondary | ICD-10-CM | POA: Diagnosis not present

## 2018-10-21 NOTE — Progress Notes (Signed)
Patient came in today to receive her annual flu vaccine. High dose Fluzone vaccine was given in the left deltoid. She tolerated well. VIS was given.

## 2018-11-18 ENCOUNTER — Other Ambulatory Visit: Payer: Self-pay | Admitting: Family Medicine

## 2019-01-02 ENCOUNTER — Other Ambulatory Visit: Payer: Self-pay | Admitting: Family Medicine

## 2019-04-08 ENCOUNTER — Other Ambulatory Visit: Payer: Self-pay | Admitting: Family Medicine

## 2019-05-01 DIAGNOSIS — H40013 Open angle with borderline findings, low risk, bilateral: Secondary | ICD-10-CM | POA: Diagnosis not present

## 2019-05-01 DIAGNOSIS — H26493 Other secondary cataract, bilateral: Secondary | ICD-10-CM | POA: Diagnosis not present

## 2019-05-01 DIAGNOSIS — H43813 Vitreous degeneration, bilateral: Secondary | ICD-10-CM | POA: Diagnosis not present

## 2019-05-01 DIAGNOSIS — H35361 Drusen (degenerative) of macula, right eye: Secondary | ICD-10-CM | POA: Diagnosis not present

## 2019-06-05 ENCOUNTER — Other Ambulatory Visit: Payer: Self-pay | Admitting: *Deleted

## 2019-06-05 DIAGNOSIS — R6889 Other general symptoms and signs: Secondary | ICD-10-CM | POA: Diagnosis not present

## 2019-06-05 DIAGNOSIS — Z20822 Contact with and (suspected) exposure to covid-19: Secondary | ICD-10-CM

## 2019-06-06 LAB — NOVEL CORONAVIRUS, NAA: SARS-CoV-2, NAA: NOT DETECTED

## 2019-06-18 DIAGNOSIS — Z0100 Encounter for examination of eyes and vision without abnormal findings: Secondary | ICD-10-CM | POA: Diagnosis not present

## 2019-06-26 ENCOUNTER — Other Ambulatory Visit: Payer: Self-pay | Admitting: Family Medicine

## 2020-01-26 ENCOUNTER — Encounter: Payer: Self-pay | Admitting: Family Medicine

## 2020-01-26 ENCOUNTER — Other Ambulatory Visit: Payer: Self-pay

## 2020-01-26 ENCOUNTER — Other Ambulatory Visit: Payer: Self-pay | Admitting: Family Medicine

## 2020-01-26 ENCOUNTER — Ambulatory Visit (INDEPENDENT_AMBULATORY_CARE_PROVIDER_SITE_OTHER): Payer: Medicare HMO | Admitting: Family Medicine

## 2020-01-26 VITALS — BP 158/76 | HR 60 | Temp 96.5°F | Resp 16 | Ht 69.0 in | Wt 202.0 lb

## 2020-01-26 DIAGNOSIS — M2550 Pain in unspecified joint: Secondary | ICD-10-CM | POA: Diagnosis not present

## 2020-01-26 DIAGNOSIS — I10 Essential (primary) hypertension: Secondary | ICD-10-CM

## 2020-01-26 DIAGNOSIS — G4452 New daily persistent headache (NDPH): Secondary | ICD-10-CM | POA: Diagnosis not present

## 2020-01-26 DIAGNOSIS — Z1231 Encounter for screening mammogram for malignant neoplasm of breast: Secondary | ICD-10-CM | POA: Diagnosis not present

## 2020-01-26 DIAGNOSIS — Z780 Asymptomatic menopausal state: Secondary | ICD-10-CM

## 2020-01-26 DIAGNOSIS — Z0001 Encounter for general adult medical examination with abnormal findings: Secondary | ICD-10-CM | POA: Diagnosis not present

## 2020-01-26 LAB — COMPLETE METABOLIC PANEL WITH GFR
AG Ratio: 1.7 (calc) (ref 1.0–2.5)
ALT: 9 U/L (ref 6–29)
AST: 14 U/L (ref 10–35)
Albumin: 4.2 g/dL (ref 3.6–5.1)
Alkaline phosphatase (APISO): 59 U/L (ref 37–153)
BUN/Creatinine Ratio: 19 (calc) (ref 6–22)
BUN: 19 mg/dL (ref 7–25)
CO2: 26 mmol/L (ref 20–32)
Calcium: 10.1 mg/dL (ref 8.6–10.4)
Chloride: 100 mmol/L (ref 98–110)
Creat: 1.02 mg/dL — ABNORMAL HIGH (ref 0.60–0.93)
GFR, Est African American: 61 mL/min/{1.73_m2} (ref >=60)
GFR, Est Non African American: 53 mL/min/{1.73_m2} — ABNORMAL LOW (ref >=60)
Globulin: 2.5 g/dL (calc) (ref 1.9–3.7)
Glucose, Bld: 95 mg/dL (ref 65–99)
Potassium: 4.4 mmol/L (ref 3.5–5.3)
Sodium: 134 mmol/L — ABNORMAL LOW (ref 135–146)
Total Bilirubin: 0.7 mg/dL (ref 0.2–1.2)
Total Protein: 6.7 g/dL (ref 6.1–8.1)

## 2020-01-26 LAB — LIPID PANEL
Cholesterol: 212 mg/dL — ABNORMAL HIGH (ref <200)
HDL: 39 mg/dL — ABNORMAL LOW (ref >=50)
LDL Cholesterol (Calc): 141 mg/dL (calc) — ABNORMAL HIGH
Non-HDL Cholesterol (Calc): 173 mg/dL (calc) — ABNORMAL HIGH (ref <130)
Total CHOL/HDL Ratio: 5.4 (calc) — ABNORMAL HIGH (ref <5.0)
Triglycerides: 185 mg/dL — ABNORMAL HIGH (ref <150)

## 2020-01-26 LAB — CBC WITH DIFFERENTIAL/PLATELET
Absolute Monocytes: 372 cells/uL (ref 200–950)
Basophils Absolute: 31 cells/uL (ref 0–200)
Basophils Relative: 0.5 %
Eosinophils Absolute: 62 cells/uL (ref 15–500)
Eosinophils Relative: 1 %
HCT: 40.1 % (ref 35.0–45.0)
Hemoglobin: 13.5 g/dL (ref 11.7–15.5)
Lymphs Abs: 1420 cells/uL (ref 850–3900)
MCH: 33.3 pg — ABNORMAL HIGH (ref 27.0–33.0)
MCHC: 33.7 g/dL (ref 32.0–36.0)
MCV: 98.8 fL (ref 80.0–100.0)
MPV: 9.1 fL (ref 7.5–12.5)
Monocytes Relative: 6 %
Neutro Abs: 4315 cells/uL (ref 1500–7800)
Neutrophils Relative %: 69.6 %
Platelets: 328 10*3/uL (ref 140–400)
RBC: 4.06 10*6/uL (ref 3.80–5.10)
RDW: 12.9 % (ref 11.0–15.0)
Total Lymphocyte: 22.9 %
WBC: 6.2 10*3/uL (ref 3.8–10.8)

## 2020-01-26 MED ORDER — TOPIRAMATE 50 MG PO TABS: 50.0000 mg | ORAL_TABLET | Freq: Two times a day (BID) | ORAL | 3 refills | Status: DC

## 2020-01-26 MED ORDER — ALPRAZOLAM 0.5 MG PO TABS: 0.5000 mg | ORAL_TABLET | Freq: Every evening | ORAL | 0 refills | Status: DC | PRN

## 2020-01-26 MED ORDER — TOPIRAMATE 25 MG PO TABS: 50.0000 mg | ORAL_TABLET | Freq: Two times a day (BID) | ORAL | 3 refills | Status: DC

## 2020-01-26 MED ORDER — MELOXICAM 7.5 MG PO TABS: ORAL_TABLET | ORAL | 2 refills | Status: DC

## 2020-01-26 NOTE — Progress Notes (Signed)
Subjective:    Patient ID: Yvonne Rich, female    DOB: Feb 12, 1941, 79 y.o.   MRN: [id number]  HPI Patient is here today for complete physical exam.  However she mentions several concerns.  First she reports pain in her shoulders, her knees, her hips.  In the past she took meloxicam however she stopped that due to the fact we did not refill it.  She does have stage III chronic kidney disease which has not been checked in 3 years however she is taking 8-10 Tylenol a day for that and her headaches try to help manage the pain.  She also reports daily headaches.  She has a history of migraines however ever since her concussion a few years ago she is having daily frontal headaches.  They are pulsatile in nature and associated with nausea.  She also has photophobia and phonophobia.  Occasionally they will turn into full-blown migraines the last days.  These occur approximately twice a month however the headaches occur on a daily basis and again prompted her to use 8-10 Tylenol per day!.  She also reports trouble sleeping.  She also complains of tinnitus that is severe in both ears.  Her blood pressure today is elevated at 158/76.  She denies any falls.  She denies any memory loss.  She denies any depression although she is extremely busy caring for her husband who has end-stage dementia.  She is overdue for mammogram.  Her last colonoscopy was at age 64 and she declines another.  She does not require Pap smear due to age Past Medical History:  Diagnosis Date  . Allergy   . Asthma   . CKD (chronic kidney disease) stage 3, GFR 30-59 ml/min   . Glaucoma   . Hypertension   . Poliomyelitis   . Sciatica of right side    Past Surgical History:  Procedure Laterality Date  . CHOLECYSTECTOMY  2002  . COSMETIC SURGERY  2012   eye lids  . ROTATOR CUFF REPAIR  1994   Current Outpatient Medications on File Prior to Visit  Medication Sig Dispense Refill  . acetaminophen (TYLENOL) 500 MG tablet Take 1,000  mg by mouth every 6 (six) hours as needed for mild pain.    Marland Kitchen albuterol (PROAIR HFA) 108 (90 BASE) MCG/ACT inhaler Inhale 2 puffs into the lungs every 6 (six) hours as needed for wheezing or shortness of breath. 1 Inhaler 1  . aspirin 81 MG tablet Take 81 mg by mouth daily.    Marland Kitchen docusate sodium (COLACE) 100 MG capsule Take 1 capsule (100 mg total) by mouth 2 (two) times daily. (Patient taking differently: Take 100 mg by mouth 2 (two) times daily as needed. )    . gabapentin (NEURONTIN) 300 MG capsule Take 1 capsule (300 mg total) by mouth 3 (three) times daily. (Patient taking differently: Take 300 mg by mouth 3 (three) times daily as needed. ) 90 capsule 3  . loratadine (CLARITIN) 10 MG tablet Take 10 mg by mouth daily.    Marland Kitchen losartan-hydrochlorothiazide (HYZAAR) 100-25 MG tablet TAKE 1 TABLET BY MOUTH ONCE DAILY 90 tablet 3  . meclizine (ANTIVERT) 25 MG tablet Take 1 tablet (25 mg total) by mouth 3 (three) times daily as needed for dizziness. 30 tablet 1  . meloxicam (MOBIC) 7.5 MG tablet TAKE 1 TABLET BY MOUTH ONCE DAILY AS NEEDED FOR PAIN 30 tablet 0  . metoprolol tartrate (LOPRESSOR) 25 MG tablet TAKE 1 TABLET BY MOUTH TWICE A DAY  180 tablet 3  . sennosides-docusate sodium (SENOKOT-S) 8.6-50 MG tablet Take 1 tablet by mouth 2 (two) times daily as needed.     . travoprost, benzalkonium, (TRAVATAN) 0.004 % ophthalmic solution Place 1 drop into both eyes daily. 2.5 mL 0  . Vitamin D, Cholecalciferol, 1000 units CAPS Take 2,000 Units by mouth daily.      No current facility-administered medications on file prior to visit.   Allergies  Allergen Reactions  . Latex Rash  . Penicillins Other (See Comments)    Breaking out around mouth  . Erythromycin Rash  . Sulfonamide Derivatives Rash   Social History   Socioeconomic History  . Marital status: Married    Spouse name: Not on file  . Number of children: Not on file  . Years of education: Not on file  . Highest education level: Not on file   Occupational History  . Not on file  Tobacco Use  . Smoking status: Never Smoker  . Smokeless tobacco: Never Used  Substance and Sexual Activity  . Alcohol use: No  . Drug use: No  . Sexual activity: Yes  Other Topics Concern  . Not on file  Social History Narrative  . Not on file   Social Determinants of Health   Financial Resource Strain:   . Difficulty of Paying Living Expenses:   Food Insecurity:   . Worried About Charity fundraiser in the Last Year:   . Arboriculturist in the Last Year:   Transportation Needs:   . Film/video editor (Medical):   Marland Kitchen Lack of Transportation (Non-Medical):   Physical Activity:   . Days of Exercise per Week:   . Minutes of Exercise per Session:   Stress:   . Feeling of Stress :   Social Connections:   . Frequency of Communication with Friends and Family:   . Frequency of Social Gatherings with Friends and Family:   . Attends Religious Services:   . Active Member of Clubs or Organizations:   . Attends Archivist Meetings:   Marland Kitchen Marital Status:   Intimate Partner Violence:   . Fear of Current or Ex-Partner:   . Emotionally Abused:   Marland Kitchen Physically Abused:   . Sexually Abused:    Family History  Problem Relation Age of Onset  . Hypertension Father   . Vision loss Father       Review of Systems  All other systems reviewed and are negative.      Objective:   Physical Exam  Constitutional: She is oriented to person, place, and time. She appears well-developed and well-nourished. No distress.  HENT:  Head: Normocephalic and atraumatic.  Right Ear: External ear normal.  Left Ear: External ear normal.  Nose: Nose normal.  Mouth/Throat: Oropharynx is clear and moist. No oropharyngeal exudate.  Eyes: Pupils are equal, round, and reactive to light. Conjunctivae and EOM are normal. Right eye exhibits no discharge. Left eye exhibits no discharge. No scleral icterus.  Neck: No JVD present. No tracheal deviation present.  No thyromegaly present.  Cardiovascular: Normal rate, regular rhythm, normal heart sounds and intact distal pulses. Exam reveals no gallop and no friction rub.  No murmur heard. Pulmonary/Chest: Effort normal and breath sounds normal. No stridor. No respiratory distress. She has no wheezes. She has no rales. She exhibits no tenderness.  Abdominal: Soft. Bowel sounds are normal. She exhibits no distension and no mass. There is no abdominal tenderness. There is no rebound and no  guarding.  Musculoskeletal:        General: No tenderness or edema. Normal range of motion.     Cervical back: Normal range of motion and neck supple.  Lymphadenopathy:    She has no cervical adenopathy.  Neurological: She is alert and oriented to person, place, and time. She displays normal reflexes. No cranial nerve deficit. She exhibits normal muscle tone. Coordination normal.  Skin: She is not diaphoretic.  Psychiatric: She has a normal mood and affect. Her behavior is normal. Judgment and thought content normal.  Vitals reviewed.         Assessment & Plan:  Postmenopausal estrogen deficiency - Plan: DG Bone Density  Encounter for screening mammogram for malignant neoplasm of breast - Plan: MM Digital Screening  Benign essential HTN - Plan: CBC with Differential/Platelet, COMPLETE METABOLIC PANEL WITH GFR, Lipid panel  New daily persistent headache  Arthralgia, unspecified joint  Patient's blood pressure is elevated.  I have recommended the patient check it twice a day every day at home and report the values to me in 1 week.  If persistently greater than 140/90 we will need to add additional medication to lower her blood pressure.  I recommended decreasing the amount of Tylenol she is taking on a daily basis.  I will monitor her kidney function as long as she has stable stage III chronic kidney disease I will start the patient on a low-dose meloxicam 7.5 mg daily however I would recommend monitoring her renal  function every 3 months while she takes that medication.  She is also taking an over-the-counter Nexium which I believe would be a good idea to help prevent NSAID induced gastritis.  Regarding her new daily persistent headaches, we will try Topamax 25 mg daily.  She will increase her dose by 25 mg every week until she is taking 50 mg twice daily.  We will recheck in 1 month.  She has stopped the gabapentin I encouraged her not to use those 2 medications together.  Initially I was going to start the patient on Xanax to help her sleep however I recommended that we hold off on the Xanax until we see how she response to Topamax.  I will schedule the patient for mammogram.  I will schedule her for a bone density test.  I will check a CBC, CMP, and fasting lipid panel.  Reassess in 1 month.

## 2020-01-27 ENCOUNTER — Other Ambulatory Visit: Payer: Self-pay | Admitting: Family Medicine

## 2020-01-27 MED ORDER — ATORVASTATIN CALCIUM 40 MG PO TABS: 40.0000 mg | ORAL_TABLET | Freq: Every day | ORAL | 1 refills | Status: DC

## 2020-02-12 ENCOUNTER — Telehealth: Payer: Self-pay | Admitting: Family Medicine

## 2020-02-12 NOTE — Telephone Encounter (Signed)
BP Readings: 131/79, 103/72, 133/84, 116/74, 163/83, 128/64, 135/66, 12/74, 113/76, 116/68, 120/81, 113/67

## 2020-02-12 NOTE — Telephone Encounter (Signed)
Overall looks great.

## 2020-02-16 NOTE — Telephone Encounter (Signed)
Call placed to patient and patient made aware.  

## 2020-04-30 ENCOUNTER — Ambulatory Visit: Payer: Self-pay

## 2020-04-30 ENCOUNTER — Other Ambulatory Visit: Payer: Self-pay

## 2020-05-03 DIAGNOSIS — Z961 Presence of intraocular lens: Secondary | ICD-10-CM | POA: Diagnosis not present

## 2020-05-03 DIAGNOSIS — H43813 Vitreous degeneration, bilateral: Secondary | ICD-10-CM | POA: Diagnosis not present

## 2020-05-03 DIAGNOSIS — H26493 Other secondary cataract, bilateral: Secondary | ICD-10-CM | POA: Diagnosis not present

## 2020-05-03 DIAGNOSIS — H40013 Open angle with borderline findings, low risk, bilateral: Secondary | ICD-10-CM | POA: Diagnosis not present

## 2020-06-22 ENCOUNTER — Other Ambulatory Visit: Payer: Self-pay | Admitting: Family Medicine

## 2020-08-05 ENCOUNTER — Other Ambulatory Visit: Payer: Self-pay

## 2020-08-05 ENCOUNTER — Ambulatory Visit
Admission: RE | Admit: 2020-08-05 | Discharge: 2020-08-05 | Disposition: A | Discharge status: Home / Self Care | Payer: Medicare HMO | Source: Ambulatory Visit | Attending: Family Medicine | Admitting: Family Medicine

## 2020-08-05 DIAGNOSIS — Z1231 Encounter for screening mammogram for malignant neoplasm of breast: Secondary | ICD-10-CM | POA: Diagnosis not present

## 2020-08-06 DIAGNOSIS — M546 Pain in thoracic spine: Secondary | ICD-10-CM | POA: Diagnosis not present

## 2020-08-06 DIAGNOSIS — M9903 Segmental and somatic dysfunction of lumbar region: Secondary | ICD-10-CM | POA: Diagnosis not present

## 2020-08-06 DIAGNOSIS — M9905 Segmental and somatic dysfunction of pelvic region: Secondary | ICD-10-CM | POA: Diagnosis not present

## 2020-08-06 DIAGNOSIS — M9902 Segmental and somatic dysfunction of thoracic region: Secondary | ICD-10-CM | POA: Diagnosis not present

## 2020-08-09 DIAGNOSIS — M9905 Segmental and somatic dysfunction of pelvic region: Secondary | ICD-10-CM | POA: Diagnosis not present

## 2020-08-09 DIAGNOSIS — M9903 Segmental and somatic dysfunction of lumbar region: Secondary | ICD-10-CM | POA: Diagnosis not present

## 2020-08-09 DIAGNOSIS — M546 Pain in thoracic spine: Secondary | ICD-10-CM | POA: Diagnosis not present

## 2020-08-09 DIAGNOSIS — M9902 Segmental and somatic dysfunction of thoracic region: Secondary | ICD-10-CM | POA: Diagnosis not present

## 2020-08-11 DIAGNOSIS — M9902 Segmental and somatic dysfunction of thoracic region: Secondary | ICD-10-CM | POA: Diagnosis not present

## 2020-08-11 DIAGNOSIS — M9905 Segmental and somatic dysfunction of pelvic region: Secondary | ICD-10-CM | POA: Diagnosis not present

## 2020-08-11 DIAGNOSIS — M546 Pain in thoracic spine: Secondary | ICD-10-CM | POA: Diagnosis not present

## 2020-08-11 DIAGNOSIS — M9903 Segmental and somatic dysfunction of lumbar region: Secondary | ICD-10-CM | POA: Diagnosis not present

## 2020-08-16 DIAGNOSIS — M9905 Segmental and somatic dysfunction of pelvic region: Secondary | ICD-10-CM | POA: Diagnosis not present

## 2020-08-16 DIAGNOSIS — M9902 Segmental and somatic dysfunction of thoracic region: Secondary | ICD-10-CM | POA: Diagnosis not present

## 2020-08-16 DIAGNOSIS — M9903 Segmental and somatic dysfunction of lumbar region: Secondary | ICD-10-CM | POA: Diagnosis not present

## 2020-08-16 DIAGNOSIS — M546 Pain in thoracic spine: Secondary | ICD-10-CM | POA: Diagnosis not present

## 2020-08-20 DIAGNOSIS — M546 Pain in thoracic spine: Secondary | ICD-10-CM | POA: Diagnosis not present

## 2020-08-20 DIAGNOSIS — M9903 Segmental and somatic dysfunction of lumbar region: Secondary | ICD-10-CM | POA: Diagnosis not present

## 2020-08-20 DIAGNOSIS — M9905 Segmental and somatic dysfunction of pelvic region: Secondary | ICD-10-CM | POA: Diagnosis not present

## 2020-08-20 DIAGNOSIS — M9902 Segmental and somatic dysfunction of thoracic region: Secondary | ICD-10-CM | POA: Diagnosis not present

## 2020-08-25 DIAGNOSIS — M9903 Segmental and somatic dysfunction of lumbar region: Secondary | ICD-10-CM | POA: Diagnosis not present

## 2020-08-25 DIAGNOSIS — M9902 Segmental and somatic dysfunction of thoracic region: Secondary | ICD-10-CM | POA: Diagnosis not present

## 2020-08-25 DIAGNOSIS — M9905 Segmental and somatic dysfunction of pelvic region: Secondary | ICD-10-CM | POA: Diagnosis not present

## 2020-08-25 DIAGNOSIS — M546 Pain in thoracic spine: Secondary | ICD-10-CM | POA: Diagnosis not present

## 2020-08-27 DIAGNOSIS — M546 Pain in thoracic spine: Secondary | ICD-10-CM | POA: Diagnosis not present

## 2020-08-27 DIAGNOSIS — M9903 Segmental and somatic dysfunction of lumbar region: Secondary | ICD-10-CM | POA: Diagnosis not present

## 2020-08-27 DIAGNOSIS — M9902 Segmental and somatic dysfunction of thoracic region: Secondary | ICD-10-CM | POA: Diagnosis not present

## 2020-08-27 DIAGNOSIS — M9905 Segmental and somatic dysfunction of pelvic region: Secondary | ICD-10-CM | POA: Diagnosis not present

## 2020-08-30 DIAGNOSIS — M546 Pain in thoracic spine: Secondary | ICD-10-CM | POA: Diagnosis not present

## 2020-08-30 DIAGNOSIS — M9903 Segmental and somatic dysfunction of lumbar region: Secondary | ICD-10-CM | POA: Diagnosis not present

## 2020-08-30 DIAGNOSIS — M9905 Segmental and somatic dysfunction of pelvic region: Secondary | ICD-10-CM | POA: Diagnosis not present

## 2020-08-30 DIAGNOSIS — M9902 Segmental and somatic dysfunction of thoracic region: Secondary | ICD-10-CM | POA: Diagnosis not present

## 2020-09-01 DIAGNOSIS — M546 Pain in thoracic spine: Secondary | ICD-10-CM | POA: Diagnosis not present

## 2020-09-01 DIAGNOSIS — M9903 Segmental and somatic dysfunction of lumbar region: Secondary | ICD-10-CM | POA: Diagnosis not present

## 2020-09-01 DIAGNOSIS — M9902 Segmental and somatic dysfunction of thoracic region: Secondary | ICD-10-CM | POA: Diagnosis not present

## 2020-09-01 DIAGNOSIS — M9905 Segmental and somatic dysfunction of pelvic region: Secondary | ICD-10-CM | POA: Diagnosis not present

## 2020-09-08 DIAGNOSIS — M9902 Segmental and somatic dysfunction of thoracic region: Secondary | ICD-10-CM | POA: Diagnosis not present

## 2020-09-08 DIAGNOSIS — M546 Pain in thoracic spine: Secondary | ICD-10-CM | POA: Diagnosis not present

## 2020-09-08 DIAGNOSIS — M9903 Segmental and somatic dysfunction of lumbar region: Secondary | ICD-10-CM | POA: Diagnosis not present

## 2020-09-08 DIAGNOSIS — M9905 Segmental and somatic dysfunction of pelvic region: Secondary | ICD-10-CM | POA: Diagnosis not present

## 2020-09-10 DIAGNOSIS — M9905 Segmental and somatic dysfunction of pelvic region: Secondary | ICD-10-CM | POA: Diagnosis not present

## 2020-09-10 DIAGNOSIS — M546 Pain in thoracic spine: Secondary | ICD-10-CM | POA: Diagnosis not present

## 2020-09-10 DIAGNOSIS — M9902 Segmental and somatic dysfunction of thoracic region: Secondary | ICD-10-CM | POA: Diagnosis not present

## 2020-09-10 DIAGNOSIS — M9903 Segmental and somatic dysfunction of lumbar region: Secondary | ICD-10-CM | POA: Diagnosis not present

## 2020-09-17 DIAGNOSIS — M9903 Segmental and somatic dysfunction of lumbar region: Secondary | ICD-10-CM | POA: Diagnosis not present

## 2020-09-17 DIAGNOSIS — M9905 Segmental and somatic dysfunction of pelvic region: Secondary | ICD-10-CM | POA: Diagnosis not present

## 2020-09-17 DIAGNOSIS — M9902 Segmental and somatic dysfunction of thoracic region: Secondary | ICD-10-CM | POA: Diagnosis not present

## 2020-09-17 DIAGNOSIS — M546 Pain in thoracic spine: Secondary | ICD-10-CM | POA: Diagnosis not present

## 2020-09-22 ENCOUNTER — Other Ambulatory Visit: Payer: Self-pay | Admitting: Family Medicine

## 2020-09-24 DIAGNOSIS — M9905 Segmental and somatic dysfunction of pelvic region: Secondary | ICD-10-CM | POA: Diagnosis not present

## 2020-09-24 DIAGNOSIS — M9902 Segmental and somatic dysfunction of thoracic region: Secondary | ICD-10-CM | POA: Diagnosis not present

## 2020-09-24 DIAGNOSIS — M9903 Segmental and somatic dysfunction of lumbar region: Secondary | ICD-10-CM | POA: Diagnosis not present

## 2020-09-24 DIAGNOSIS — M546 Pain in thoracic spine: Secondary | ICD-10-CM | POA: Diagnosis not present

## 2020-10-04 DIAGNOSIS — M9903 Segmental and somatic dysfunction of lumbar region: Secondary | ICD-10-CM | POA: Diagnosis not present

## 2020-10-04 DIAGNOSIS — M546 Pain in thoracic spine: Secondary | ICD-10-CM | POA: Diagnosis not present

## 2020-10-04 DIAGNOSIS — M9905 Segmental and somatic dysfunction of pelvic region: Secondary | ICD-10-CM | POA: Diagnosis not present

## 2020-10-04 DIAGNOSIS — M9902 Segmental and somatic dysfunction of thoracic region: Secondary | ICD-10-CM | POA: Diagnosis not present

## 2020-10-18 DIAGNOSIS — M9905 Segmental and somatic dysfunction of pelvic region: Secondary | ICD-10-CM | POA: Diagnosis not present

## 2020-10-18 DIAGNOSIS — M9903 Segmental and somatic dysfunction of lumbar region: Secondary | ICD-10-CM | POA: Diagnosis not present

## 2020-10-18 DIAGNOSIS — M546 Pain in thoracic spine: Secondary | ICD-10-CM | POA: Diagnosis not present

## 2020-10-18 DIAGNOSIS — M9902 Segmental and somatic dysfunction of thoracic region: Secondary | ICD-10-CM | POA: Diagnosis not present

## 2020-11-17 DIAGNOSIS — M9903 Segmental and somatic dysfunction of lumbar region: Secondary | ICD-10-CM | POA: Diagnosis not present

## 2020-11-17 DIAGNOSIS — M9905 Segmental and somatic dysfunction of pelvic region: Secondary | ICD-10-CM | POA: Diagnosis not present

## 2020-11-17 DIAGNOSIS — M9902 Segmental and somatic dysfunction of thoracic region: Secondary | ICD-10-CM | POA: Diagnosis not present

## 2020-11-17 DIAGNOSIS — M546 Pain in thoracic spine: Secondary | ICD-10-CM | POA: Diagnosis not present

## 2020-12-29 DIAGNOSIS — M9902 Segmental and somatic dysfunction of thoracic region: Secondary | ICD-10-CM | POA: Diagnosis not present

## 2020-12-29 DIAGNOSIS — M9905 Segmental and somatic dysfunction of pelvic region: Secondary | ICD-10-CM | POA: Diagnosis not present

## 2020-12-29 DIAGNOSIS — M546 Pain in thoracic spine: Secondary | ICD-10-CM | POA: Diagnosis not present

## 2020-12-29 DIAGNOSIS — M9903 Segmental and somatic dysfunction of lumbar region: Secondary | ICD-10-CM | POA: Diagnosis not present

## 2021-02-01 ENCOUNTER — Other Ambulatory Visit: Payer: Self-pay | Admitting: Family Medicine

## 2021-02-10 IMAGING — MG DIGITAL SCREENING BILAT W/ CAD
4 series · 4 of 4 positions shown · non-contrast
Comparison: Previous exam(s).

CLINICAL DATA: Screening.

EXAM:
DIGITAL SCREENING BILATERAL MAMMOGRAM WITH CAD

[L MLO]
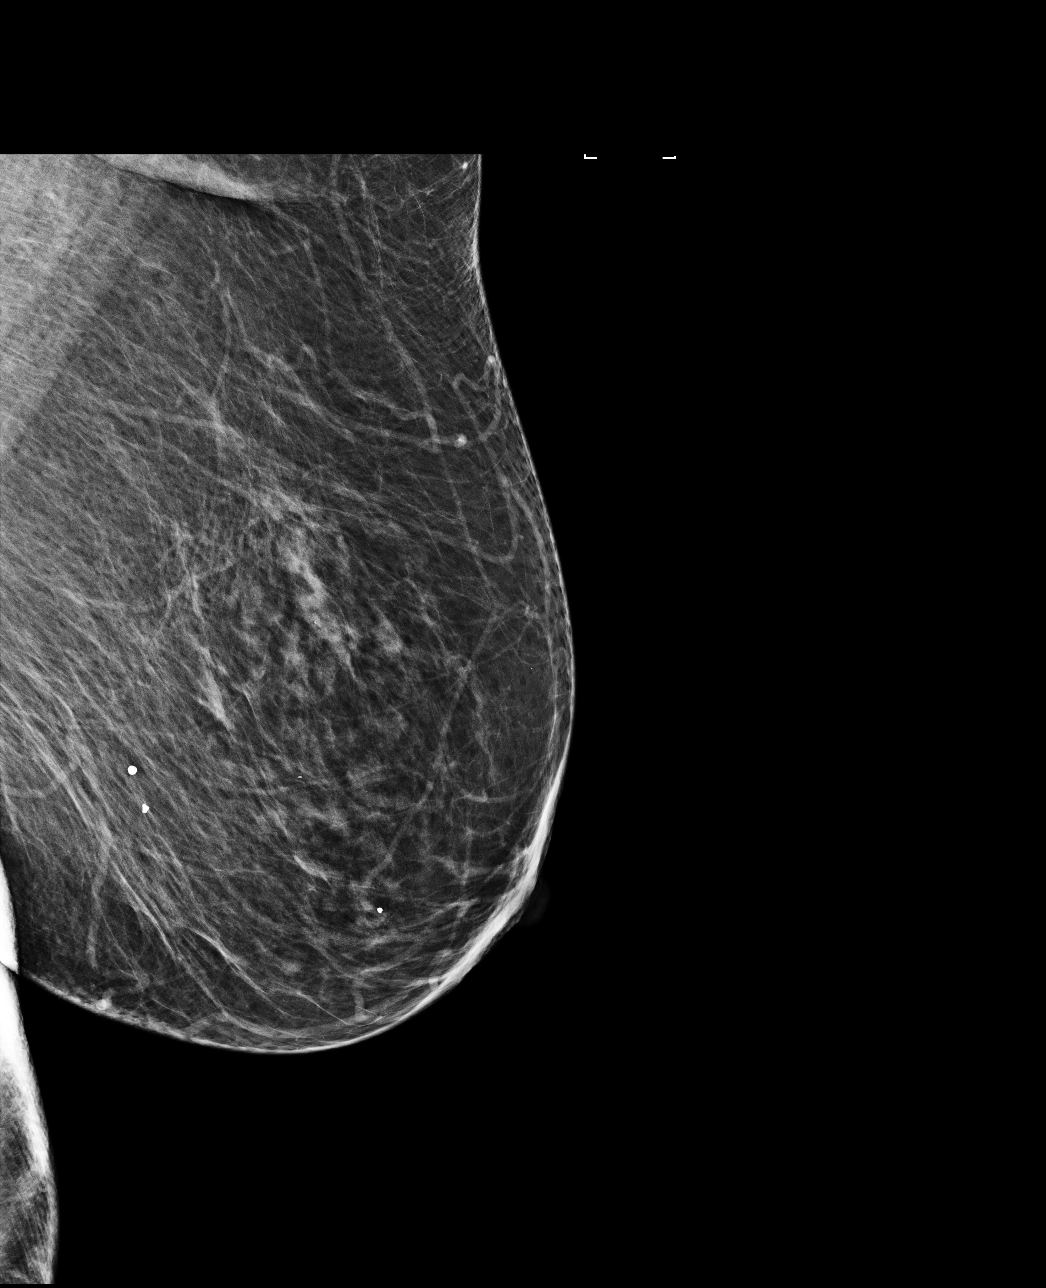

[L CC]
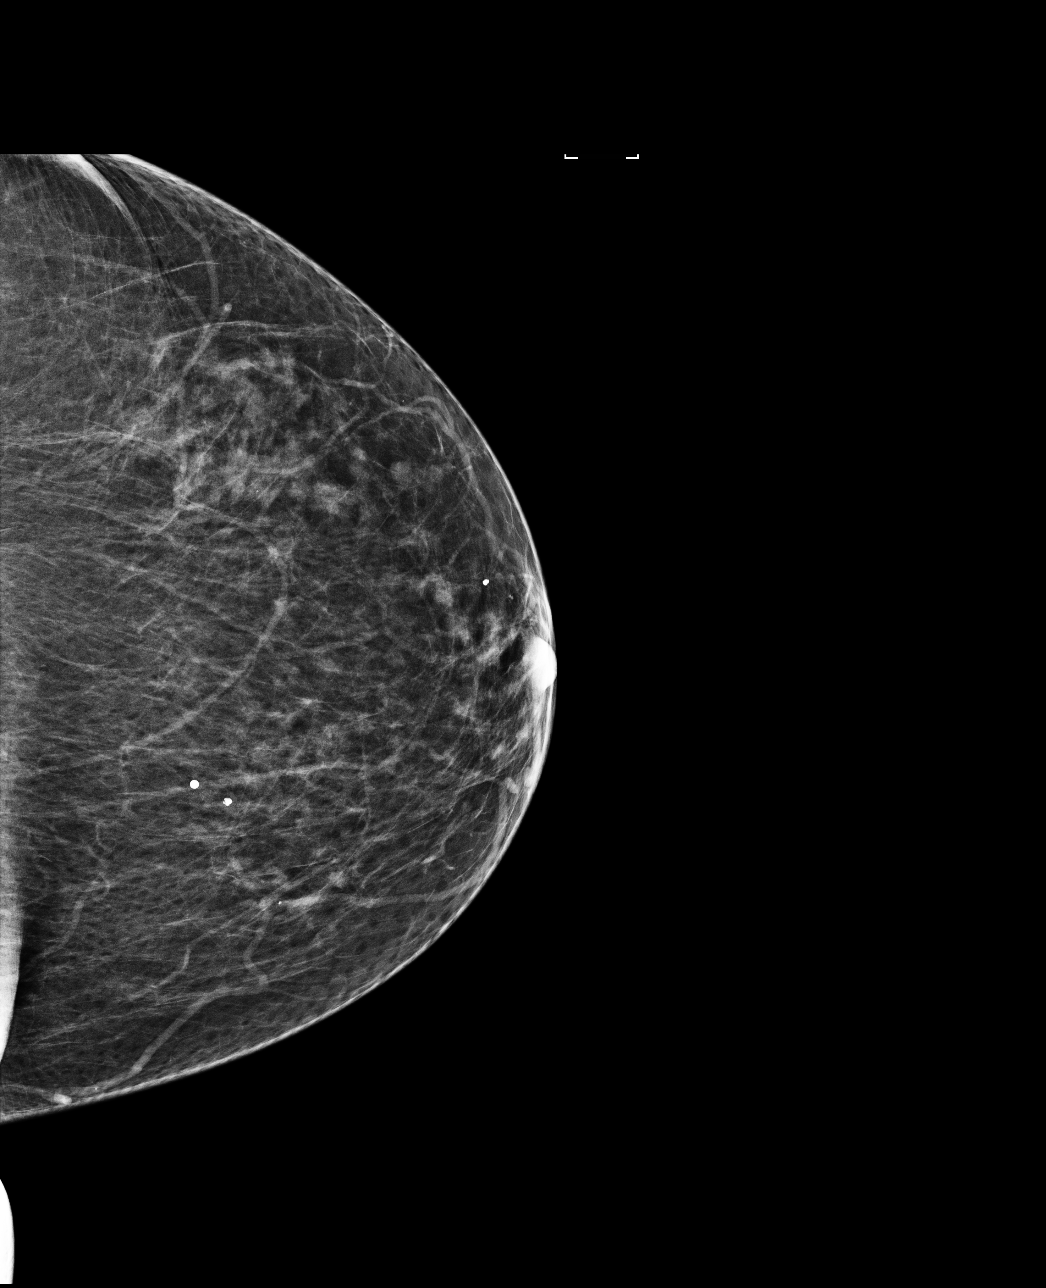

[R MLO]
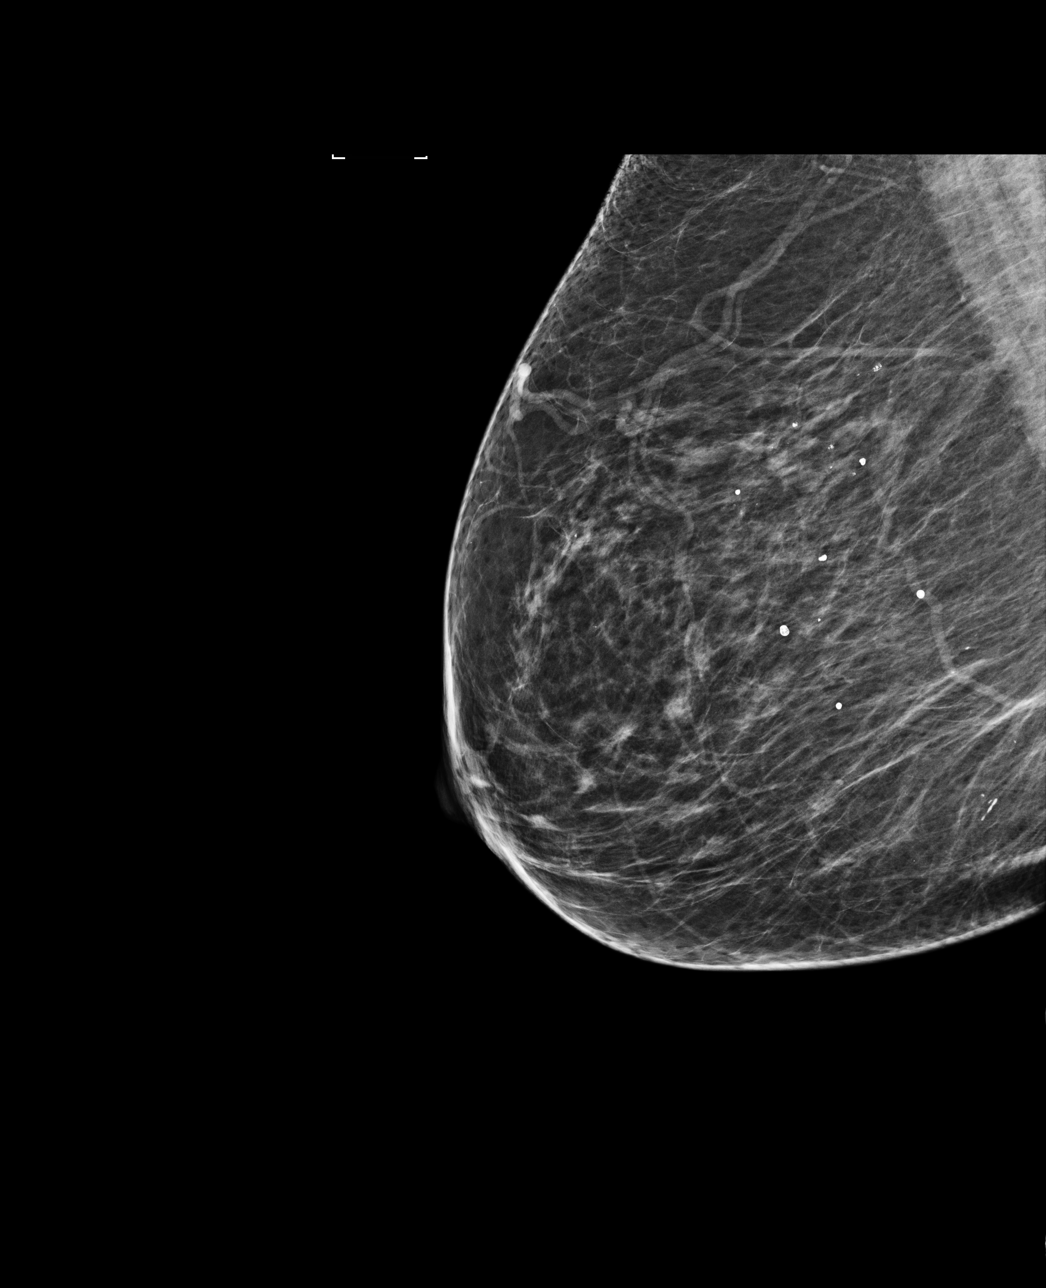

[R CC]
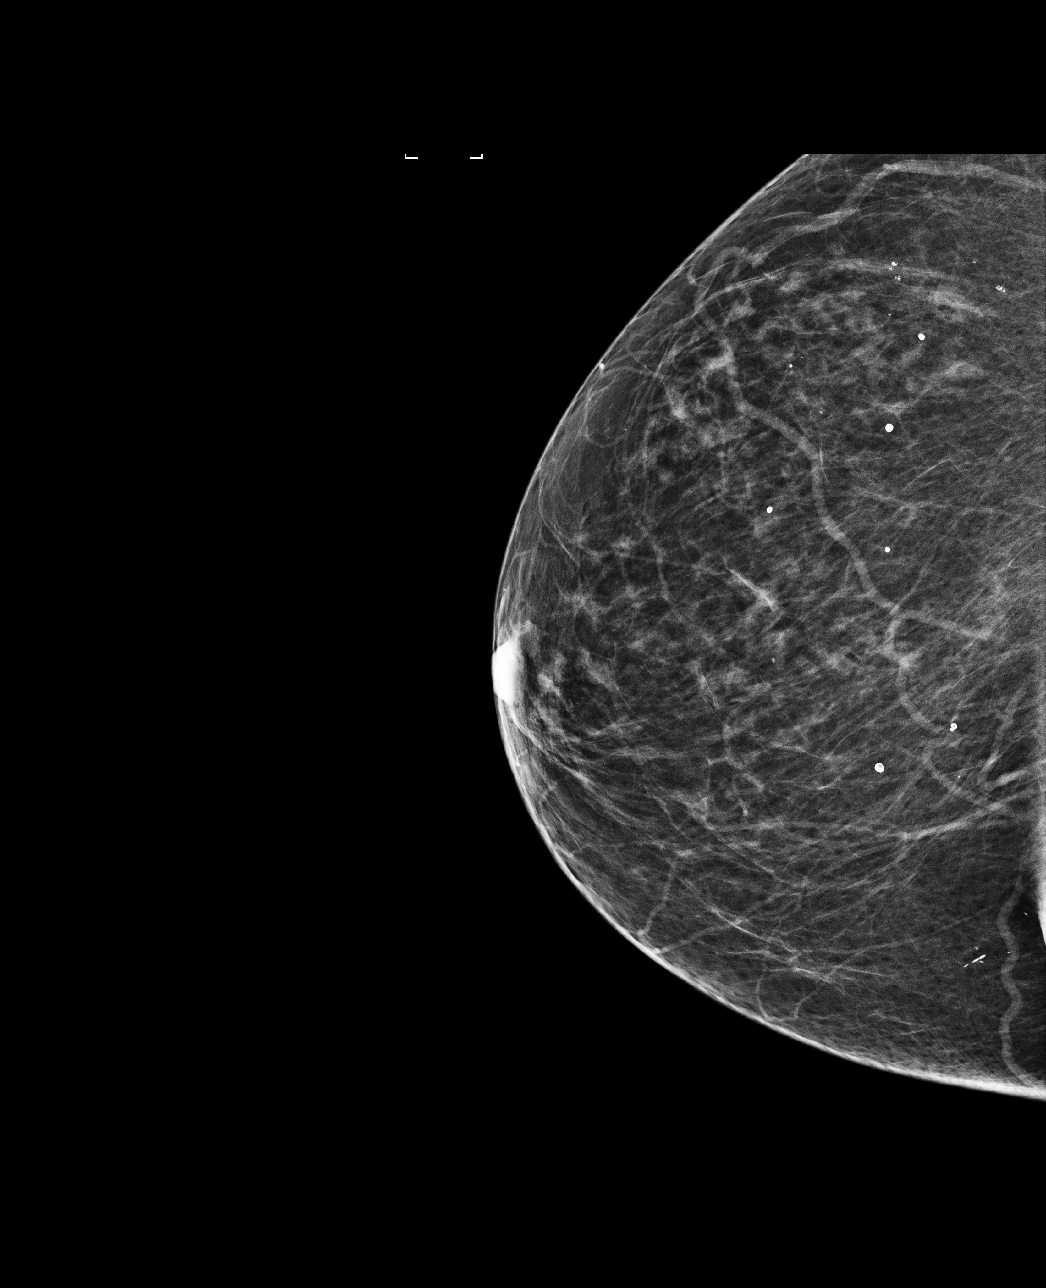

[4 of 4 positions shown; findings below may reference images not displayed]

ACR Breast Density Category b: There are scattered areas of
fibroglandular density.
FINDINGS: There are no findings suspicious for malignancy. Images were
processed with CAD.
IMPRESSION: No mammographic evidence of malignancy. A result letter of this
screening mammogram will be mailed directly to the patient.

RECOMMENDATION:
Screening mammogram in one year. (Code:AS-G-LCT)

BI-RADS CATEGORY  1: Negative.

## 2021-02-23 DIAGNOSIS — M9903 Segmental and somatic dysfunction of lumbar region: Secondary | ICD-10-CM | POA: Diagnosis not present

## 2021-02-23 DIAGNOSIS — M9902 Segmental and somatic dysfunction of thoracic region: Secondary | ICD-10-CM | POA: Diagnosis not present

## 2021-02-23 DIAGNOSIS — M5136 Other intervertebral disc degeneration, lumbar region: Secondary | ICD-10-CM | POA: Diagnosis not present

## 2021-02-23 DIAGNOSIS — M546 Pain in thoracic spine: Secondary | ICD-10-CM | POA: Diagnosis not present

## 2021-02-23 DIAGNOSIS — M9905 Segmental and somatic dysfunction of pelvic region: Secondary | ICD-10-CM | POA: Diagnosis not present

## 2021-04-20 DIAGNOSIS — M9902 Segmental and somatic dysfunction of thoracic region: Secondary | ICD-10-CM | POA: Diagnosis not present

## 2021-04-20 DIAGNOSIS — M9905 Segmental and somatic dysfunction of pelvic region: Secondary | ICD-10-CM | POA: Diagnosis not present

## 2021-04-20 DIAGNOSIS — M9903 Segmental and somatic dysfunction of lumbar region: Secondary | ICD-10-CM | POA: Diagnosis not present

## 2021-04-20 DIAGNOSIS — M5136 Other intervertebral disc degeneration, lumbar region: Secondary | ICD-10-CM | POA: Diagnosis not present

## 2021-04-20 DIAGNOSIS — M546 Pain in thoracic spine: Secondary | ICD-10-CM | POA: Diagnosis not present

## 2021-04-29 ENCOUNTER — Other Ambulatory Visit: Payer: Self-pay

## 2021-04-29 ENCOUNTER — Encounter: Payer: Self-pay | Admitting: Family Medicine

## 2021-04-29 ENCOUNTER — Ambulatory Visit (INDEPENDENT_AMBULATORY_CARE_PROVIDER_SITE_OTHER): Payer: Medicare HMO | Admitting: Family Medicine

## 2021-04-29 VITALS — BP 130/68 | HR 80 | Temp 97.9°F | Resp 16 | Ht 69.0 in | Wt 193.0 lb

## 2021-04-29 DIAGNOSIS — Z0000 Encounter for general adult medical examination without abnormal findings: Secondary | ICD-10-CM

## 2021-04-29 DIAGNOSIS — Z1231 Encounter for screening mammogram for malignant neoplasm of breast: Secondary | ICD-10-CM

## 2021-04-29 DIAGNOSIS — Z0001 Encounter for general adult medical examination with abnormal findings: Secondary | ICD-10-CM

## 2021-04-29 DIAGNOSIS — Z780 Asymptomatic menopausal state: Secondary | ICD-10-CM

## 2021-04-29 DIAGNOSIS — I10 Essential (primary) hypertension: Secondary | ICD-10-CM | POA: Diagnosis not present

## 2021-04-29 NOTE — Progress Notes (Signed)
Subjective:    Patient ID: Yvonne Rich, female    DOB: 1940-12-23, 80 y.o.   MRN: [id number]  HPI Patient is here today for complete physical exam.  She has a cystlike mass on the crown of her head that has been there for 3 years.  It is about 2.5 cm in diameter.  It is freely mobile and nontender and is not adherent to the skull.  This appears to be a sebaceous cyst.  Mammogram is due in October.  She has never had a bone density test I recommended last year.  She does request that I schedule these both together if possible.  Last colonoscopy was age 44.  She declines another colonoscopy and she does not require Pap smear due to her age. Past Medical History:  Diagnosis Date   Allergy    Asthma    CKD (chronic kidney disease) stage 3, GFR 30-59 ml/min (HCC)    Glaucoma    Hypertension    Poliomyelitis    Sciatica of right side    Past Surgical History:  Procedure Laterality Date   CHOLECYSTECTOMY  2002   COSMETIC SURGERY  2012   eye lids   ROTATOR CUFF REPAIR  1994   Current Outpatient Medications on File Prior to Visit  Medication Sig Dispense Refill   acetaminophen (TYLENOL) 500 MG tablet Take 1,000 mg by mouth every 6 (six) hours as needed for mild pain.     albuterol (PROAIR HFA) 108 (90 BASE) MCG/ACT inhaler Inhale 2 puffs into the lungs every 6 (six) hours as needed for wheezing or shortness of breath. 1 Inhaler 1   aspirin 81 MG tablet Take 81 mg by mouth daily.     loratadine (CLARITIN) 10 MG tablet Take 10 mg by mouth daily.     losartan-hydrochlorothiazide (HYZAAR) 100-25 MG tablet TAKE 1 TABLET BY MOUTH ONCE DAILY 90 tablet 3   meclizine (ANTIVERT) 25 MG tablet Take 1 tablet (25 mg total) by mouth 3 (three) times daily as needed for dizziness. 30 tablet 1   metoprolol tartrate (LOPRESSOR) 25 MG tablet TAKE ONE TABLET BY MOUTH TWICE A DAY 180 tablet 1   Vitamin D, Cholecalciferol, 1000 units CAPS Take 2,000 Units by mouth daily.      No current  facility-administered medications on file prior to visit.   Allergies  Allergen Reactions   Latex Rash   Penicillins Other (See Comments)    Breaking out around mouth   Erythromycin Rash   Sulfonamide Derivatives Rash   Social History   Socioeconomic History   Marital status: Married    Spouse name: Not on file   Number of children: Not on file   Years of education: Not on file   Highest education level: Not on file  Occupational History   Not on file  Tobacco Use   Smoking status: Never   Smokeless tobacco: Never  Substance and Sexual Activity   Alcohol use: No   Drug use: No   Sexual activity: Yes  Other Topics Concern   Not on file  Social History Narrative   Not on file   Social Determinants of Health   Financial Resource Strain: Not on file  Food Insecurity: Not on file  Transportation Needs: Not on file  Physical Activity: Not on file  Stress: Not on file  Social Connections: Not on file  Intimate Partner Violence: Not on file   Family History  Problem Relation Age of Onset   Hypertension Father  Vision loss Father       Review of Systems     Objective:   Physical Exam Constitutional:      General: She is not in acute distress.    Appearance: Normal appearance. She is normal weight. She is not ill-appearing, toxic-appearing or diaphoretic.  HENT:     Head: Normocephalic and atraumatic.     Right Ear: Tympanic membrane, ear canal and external ear normal.     Left Ear: Tympanic membrane, ear canal and external ear normal.     Nose: Nose normal. No congestion or rhinorrhea.     Mouth/Throat:     Mouth: Mucous membranes are moist.     Pharynx: Oropharynx is clear. No oropharyngeal exudate.  Eyes:     Extraocular Movements: Extraocular movements intact.     Conjunctiva/sclera: Conjunctivae normal.     Pupils: Pupils are equal, round, and reactive to light.  Neck:     Vascular: No carotid bruit.  Cardiovascular:     Rate and Rhythm: Normal  rate and regular rhythm.     Pulses: Normal pulses.     Heart sounds: Normal heart sounds. No murmur heard.   No friction rub. No gallop.  Pulmonary:     Effort: Pulmonary effort is normal. No respiratory distress.     Breath sounds: Normal breath sounds. No stridor. No wheezing, rhonchi or rales.  Abdominal:     General: Abdomen is flat. Bowel sounds are normal. There is no distension.     Palpations: Abdomen is soft. There is no mass.     Tenderness: There is no abdominal tenderness. There is no guarding or rebound.     Hernia: No hernia is present.  Musculoskeletal:        General: No swelling, tenderness, deformity or signs of injury. Normal range of motion.     Cervical back: Neck supple. No rigidity or tenderness.     Right lower leg: No edema.     Left lower leg: No edema.  Lymphadenopathy:     Cervical: No cervical adenopathy.  Skin:    General: Skin is warm.     Coloration: Skin is not jaundiced or pale.     Findings: No bruising, erythema, lesion or rash.  Neurological:     General: No focal deficit present.     Mental Status: She is alert and oriented to person, place, and time. Mental status is at baseline.     Cranial Nerves: No cranial nerve deficit.     Sensory: No sensory deficit.     Motor: No weakness.     Coordination: Coordination normal.     Gait: Gait normal.     Deep Tendon Reflexes: Reflexes normal.  Psychiatric:        Mood and Affect: Mood normal.        Behavior: Behavior normal.        Thought Content: Thought content normal.        Judgment: Judgment normal.          Assessment & Plan:  General medical exam - Plan: CBC with Differential/Platelet, COMPLETE METABOLIC PANEL WITH GFR Blood pressure today is well controlled.  Check CBC and CMP.  Last year her cholesterol was found to be elevated however she refuses statins due to previous intolerance.  I will schedule her for mammogram as well as a bone density test.  I recommended 1200 mg a day of  calcium and 1000 units a day of vitamin D.  Also  recommended 2000 mg a day official.  She denies any falls, memory loss, or depression.  Regular anticipatory guidance is provided.  The lesion on the crown of her head appears to be a 2.5 cm sebaceous cyst

## 2021-04-30 LAB — CBC WITH DIFFERENTIAL/PLATELET
Absolute Monocytes: 479 cells/uL (ref 200–950)
Basophils Absolute: 38 cells/uL (ref 0–200)
Basophils Relative: 0.6 %
Eosinophils Absolute: 132 cells/uL (ref 15–500)
Eosinophils Relative: 2.1 %
HCT: 37.9 % (ref 35.0–45.0)
Hemoglobin: 12.7 g/dL (ref 11.7–15.5)
Lymphs Abs: 1544 cells/uL (ref 850–3900)
MCH: 33.2 pg — ABNORMAL HIGH (ref 27.0–33.0)
MCHC: 33.5 g/dL (ref 32.0–36.0)
MCV: 99 fL (ref 80.0–100.0)
MPV: 9.1 fL (ref 7.5–12.5)
Monocytes Relative: 7.6 %
Neutro Abs: 4108 cells/uL (ref 1500–7800)
Neutrophils Relative %: 65.2 %
Platelets: 319 10*3/uL (ref 140–400)
RBC: 3.83 10*6/uL (ref 3.80–5.10)
RDW: 13.1 % (ref 11.0–15.0)
Total Lymphocyte: 24.5 %
WBC: 6.3 10*3/uL (ref 3.8–10.8)

## 2021-04-30 LAB — COMPLETE METABOLIC PANEL WITH GFR
AG Ratio: 2 (calc) (ref 1.0–2.5)
ALT: 7 U/L (ref 6–29)
AST: 10 U/L (ref 10–35)
Albumin: 4.4 g/dL (ref 3.6–5.1)
Alkaline phosphatase (APISO): 67 U/L (ref 37–153)
BUN/Creatinine Ratio: 26 (calc) — ABNORMAL HIGH (ref 6–22)
BUN: 38 mg/dL — ABNORMAL HIGH (ref 7–25)
CO2: 26 mmol/L (ref 20–32)
Calcium: 10 mg/dL (ref 8.6–10.4)
Chloride: 98 mmol/L (ref 98–110)
Creat: 1.46 mg/dL — ABNORMAL HIGH (ref 0.60–1.00)
Globulin: 2.2 g/dL (calc) (ref 1.9–3.7)
Glucose, Bld: 91 mg/dL (ref 65–99)
Potassium: 4.2 mmol/L (ref 3.5–5.3)
Sodium: 133 mmol/L — ABNORMAL LOW (ref 135–146)
Total Bilirubin: 0.5 mg/dL (ref 0.2–1.2)
Total Protein: 6.6 g/dL (ref 6.1–8.1)
eGFR: 36 mL/min/{1.73_m2} — ABNORMAL LOW (ref >=60)

## 2021-06-15 DIAGNOSIS — M5136 Other intervertebral disc degeneration, lumbar region: Secondary | ICD-10-CM | POA: Diagnosis not present

## 2021-06-15 DIAGNOSIS — M9903 Segmental and somatic dysfunction of lumbar region: Secondary | ICD-10-CM | POA: Diagnosis not present

## 2021-06-15 DIAGNOSIS — M9905 Segmental and somatic dysfunction of pelvic region: Secondary | ICD-10-CM | POA: Diagnosis not present

## 2021-06-15 DIAGNOSIS — M9902 Segmental and somatic dysfunction of thoracic region: Secondary | ICD-10-CM | POA: Diagnosis not present

## 2021-06-15 DIAGNOSIS — M546 Pain in thoracic spine: Secondary | ICD-10-CM | POA: Diagnosis not present

## 2021-06-27 DIAGNOSIS — D3131 Benign neoplasm of right choroid: Secondary | ICD-10-CM | POA: Diagnosis not present

## 2021-06-27 DIAGNOSIS — H18519 Endothelial corneal dystrophy, unspecified eye: Secondary | ICD-10-CM | POA: Diagnosis not present

## 2021-06-27 DIAGNOSIS — H40013 Open angle with borderline findings, low risk, bilateral: Secondary | ICD-10-CM | POA: Diagnosis not present

## 2021-06-27 DIAGNOSIS — H35361 Drusen (degenerative) of macula, right eye: Secondary | ICD-10-CM | POA: Diagnosis not present

## 2021-07-01 ENCOUNTER — Other Ambulatory Visit: Payer: Self-pay | Admitting: Family Medicine

## 2021-08-08 DIAGNOSIS — H26492 Other secondary cataract, left eye: Secondary | ICD-10-CM | POA: Diagnosis not present

## 2021-08-22 DIAGNOSIS — H524 Presbyopia: Secondary | ICD-10-CM | POA: Diagnosis not present

## 2021-08-26 ENCOUNTER — Other Ambulatory Visit: Payer: Self-pay | Admitting: Family Medicine

## 2021-09-15 DIAGNOSIS — M4316 Spondylolisthesis, lumbar region: Secondary | ICD-10-CM | POA: Diagnosis not present

## 2021-09-15 DIAGNOSIS — M47896 Other spondylosis, lumbar region: Secondary | ICD-10-CM | POA: Diagnosis not present

## 2021-09-15 DIAGNOSIS — M545 Low back pain, unspecified: Secondary | ICD-10-CM | POA: Insufficient documentation

## 2021-10-21 DIAGNOSIS — M5416 Radiculopathy, lumbar region: Secondary | ICD-10-CM | POA: Diagnosis not present

## 2021-10-25 DIAGNOSIS — M5416 Radiculopathy, lumbar region: Secondary | ICD-10-CM | POA: Diagnosis not present

## 2021-11-04 DIAGNOSIS — M5416 Radiculopathy, lumbar region: Secondary | ICD-10-CM | POA: Diagnosis not present

## 2021-11-17 DIAGNOSIS — M2569 Stiffness of other specified joint, not elsewhere classified: Secondary | ICD-10-CM | POA: Insufficient documentation

## 2021-12-14 DIAGNOSIS — M2560 Stiffness of unspecified joint, not elsewhere classified: Secondary | ICD-10-CM | POA: Diagnosis not present

## 2021-12-14 DIAGNOSIS — M545 Low back pain, unspecified: Secondary | ICD-10-CM | POA: Diagnosis not present

## 2021-12-16 DIAGNOSIS — M5416 Radiculopathy, lumbar region: Secondary | ICD-10-CM | POA: Diagnosis not present

## 2022-01-06 DIAGNOSIS — M2560 Stiffness of unspecified joint, not elsewhere classified: Secondary | ICD-10-CM | POA: Diagnosis not present

## 2022-01-06 DIAGNOSIS — M545 Low back pain, unspecified: Secondary | ICD-10-CM | POA: Diagnosis not present

## 2022-01-11 DIAGNOSIS — M545 Low back pain, unspecified: Secondary | ICD-10-CM | POA: Diagnosis not present

## 2022-01-11 DIAGNOSIS — M2560 Stiffness of unspecified joint, not elsewhere classified: Secondary | ICD-10-CM | POA: Diagnosis not present

## 2022-02-02 DIAGNOSIS — M545 Low back pain, unspecified: Secondary | ICD-10-CM | POA: Diagnosis not present

## 2022-02-02 DIAGNOSIS — M2560 Stiffness of unspecified joint, not elsewhere classified: Secondary | ICD-10-CM | POA: Diagnosis not present

## 2022-03-02 ENCOUNTER — Other Ambulatory Visit: Payer: Self-pay | Admitting: Family Medicine

## 2022-03-03 ENCOUNTER — Other Ambulatory Visit: Payer: Self-pay | Admitting: Family Medicine

## 2022-03-03 NOTE — Telephone Encounter (Signed)
Patient called, left VM to return the call to the office to scheduled an appt for medication refill request.   

## 2022-03-03 NOTE — Telephone Encounter (Signed)
Requested medication (s) are due for refill today: yes  Requested medication (s) are on the active medication list: yes  Last refill:  08/26/21 #180/1  Future visit scheduled: yes  Notes to clinic: pt is due for an appt and called [facility] to schedule.      Requested Prescriptions  Pending Prescriptions Disp Refills   metoprolol tartrate (LOPRESSOR) 25 MG tablet [Pharmacy Med Name: METOPROLOL TARTRATE 25 MG TAB] 180 tablet 1    Sig: TAKE ONE TABLET BY MOUTH TWICE A DAY     Cardiovascular:  Beta Blockers Failed - 03/02/2022 12:50 PM      Failed - Valid encounter within last 6 months    Recent Outpatient Visits           10 months ago General medical exam   [place] Susy Frizzle, MD   2 years ago Postmenopausal estrogen deficiency   [place] Susy Frizzle, MD   4 years ago Acute bacterial rhinosinusitis   [place] Dennard Schaumann, Cammie Mcgee, MD   4 years ago Needs flu shot   [place] Pickard, Cammie Mcgee, MD   5 years ago Hospital discharge follow-up   [place], Warren T, MD               Passed - Last BP in normal range    BP Readings from Last 1 Encounters:  04/29/21 130/68         Passed - Last Heart Rate in normal range    Pulse Readings from Last 1 Encounters:  04/29/21 80

## 2022-03-07 NOTE — Telephone Encounter (Signed)
Requested Prescriptions  Pending Prescriptions Disp Refills  . metoprolol tartrate (LOPRESSOR) 25 MG tablet [Pharmacy Med Name: METOPROLOL TARTRATE 25 MG TAB] 60 tablet     Sig: TAKE ONE TABLET BY MOUTH TWICE A DAY     Cardiovascular:  Beta Blockers Failed - 03/03/2022  5:35 PM      Failed - Valid encounter within last 6 months    Recent Outpatient Visits          10 months ago General medical exam   [place] Susy Frizzle, MD   2 years ago Postmenopausal estrogen deficiency   [place] Susy Frizzle, MD   4 years ago Acute bacterial rhinosinusitis   [place] Dennard Schaumann, Cammie Mcgee, MD   4 years ago Needs flu shot   [place] Pickard, Cammie Mcgee, MD   5 years ago Hospital discharge follow-up   [place], Warren T, MD             Passed - Last BP in normal range    BP Readings from Last 1 Encounters:  04/29/21 130/68         Passed - Last Heart Rate in normal range    Pulse Readings from Last 1 Encounters:  04/29/21 80

## 2022-03-13 DIAGNOSIS — H52213 Irregular astigmatism, bilateral: Secondary | ICD-10-CM | POA: Diagnosis not present

## 2022-03-13 DIAGNOSIS — Z9841 Cataract extraction status, right eye: Secondary | ICD-10-CM | POA: Diagnosis not present

## 2022-03-13 DIAGNOSIS — Z0100 Encounter for examination of eyes and vision without abnormal findings: Secondary | ICD-10-CM | POA: Diagnosis not present

## 2022-03-13 DIAGNOSIS — Z9842 Cataract extraction status, left eye: Secondary | ICD-10-CM | POA: Diagnosis not present

## 2022-05-04 ENCOUNTER — Ambulatory Visit (INDEPENDENT_AMBULATORY_CARE_PROVIDER_SITE_OTHER): Payer: Medicare HMO | Admitting: Family Medicine

## 2022-05-04 VITALS — BP 140/68 | HR 96 | Temp 97.9°F | Ht 69.0 in | Wt 179.0 lb

## 2022-05-04 DIAGNOSIS — Z0000 Encounter for general adult medical examination without abnormal findings: Secondary | ICD-10-CM | POA: Diagnosis not present

## 2022-05-04 DIAGNOSIS — Z780 Asymptomatic menopausal state: Secondary | ICD-10-CM | POA: Diagnosis not present

## 2022-05-04 DIAGNOSIS — Z1231 Encounter for screening mammogram for malignant neoplasm of breast: Secondary | ICD-10-CM

## 2022-05-04 DIAGNOSIS — L570 Actinic keratosis: Secondary | ICD-10-CM | POA: Diagnosis not present

## 2022-05-04 DIAGNOSIS — N183 Chronic kidney disease, stage 3 unspecified: Secondary | ICD-10-CM | POA: Diagnosis not present

## 2022-05-04 DIAGNOSIS — E785 Hyperlipidemia, unspecified: Secondary | ICD-10-CM | POA: Diagnosis not present

## 2022-05-04 MED ORDER — ALPRAZOLAM 0.5 MG PO TABS: 0.5000 mg | ORAL_TABLET | Freq: Every evening | ORAL | 0 refills | Status: DC | PRN

## 2022-05-04 NOTE — Progress Notes (Signed)
Subjective:    Patient ID: Yvonne Rich, female    DOB: [birth year], 81 y.o.   MRN: [id number]  HPI Patient is here today for complete physical exam.  Patient has a history of chronic kidney disease.  She is avoiding arthritis drugs other than Tylenol.  Her blood pressure is acceptable today at 140/68.  She is due for a flu shot as well as the shingles vaccine.  She declines any further vaccinations.  She is due for a mammogram as well as a bone density test.  She also request a referral to a dermatologist.  There is a scaly red macule on the bridge of her nose that appears to be an actinic keratoses versus a precancer.  She also has a similar lesion on her right cheek.  She would like to see dermatology to have these removed or treated.  She also reports trouble with insomnia.  She states that she falls asleep easily however she wakes up after 3 to 4 hours to use the restroom and then she is unable to fall back asleep for 3 to 4 hours.  This leaves her feeling tired the next day.  She has tried melatonin with no success.  She has tried Benadryl but this made her feel "hung over" the following day. Past Medical History:  Diagnosis Date   Allergy    Asthma    CKD (chronic kidney disease) stage 3, GFR 30-59 ml/min (HCC)    Glaucoma    Hypertension    Poliomyelitis    Sciatica of right side    Past Surgical History:  Procedure Laterality Date   CHOLECYSTECTOMY  2002   COSMETIC SURGERY  2012   eye lids   ROTATOR CUFF REPAIR  1994   Current Outpatient Medications on File Prior to Visit  Medication Sig Dispense Refill   acetaminophen (TYLENOL) 500 MG tablet Take 1,000 mg by mouth every 6 (six) hours as needed for mild pain.     albuterol (PROAIR HFA) 108 (90 BASE) MCG/ACT inhaler Inhale 2 puffs into the lungs every 6 (six) hours as needed for wheezing or shortness of breath. 1 Inhaler 1   aspirin 81 MG tablet Take 81 mg by mouth daily.     loratadine (CLARITIN) 10 MG tablet Take 10 mg by  mouth daily.     losartan-hydrochlorothiazide (HYZAAR) 100-25 MG tablet TAKE 1 TABLET BY MOUTH ONCE DAILY 90 tablet 3   meclizine (ANTIVERT) 25 MG tablet Take 1 tablet (25 mg total) by mouth 3 (three) times daily as needed for dizziness. 30 tablet 1   metoprolol tartrate (LOPRESSOR) 25 MG tablet TAKE ONE TABLET BY MOUTH TWICE A DAY 60 tablet 2   Vitamin D, Cholecalciferol, 1000 units CAPS Take 2,000 Units by mouth daily.      No current facility-administered medications on file prior to visit.   Allergies  Allergen Reactions   Latex Rash   Penicillins Other (See Comments)    Breaking out around mouth   Erythromycin Rash   Sulfonamide Derivatives Rash   Social History   Socioeconomic History   Marital status: Married    Spouse name: Not on file   Number of children: Not on file   Years of education: Not on file   Highest education level: Not on file  Occupational History   Not on file  Tobacco Use   Smoking status: Never   Smokeless tobacco: Never  Substance and Sexual Activity   Alcohol use: No   Drug  use: No   Sexual activity: Yes  Other Topics Concern   Not on file  Social History Narrative   Not on file   Social Determinants of Health   Financial Resource Strain: Not on file  Food Insecurity: Not on file  Transportation Needs: Not on file  Physical Activity: Not on file  Stress: Not on file  Social Connections: Not on file  Intimate Partner Violence: Not on file   Family History  Problem Relation Age of Onset   Hypertension Father    Vision loss Father       Review of Systems     Objective:   Physical Exam Constitutional:      General: She is not in acute distress.    Appearance: Normal appearance. She is normal weight. She is not ill-appearing, toxic-appearing or diaphoretic.  HENT:     Head: Normocephalic and atraumatic.     Right Ear: Tympanic membrane, ear canal and external ear normal.     Left Ear: Tympanic membrane, ear canal and external  ear normal.     Nose: Nose normal. No congestion or rhinorrhea.     Mouth/Throat:     Mouth: Mucous membranes are moist.     Pharynx: Oropharynx is clear. No oropharyngeal exudate.  Eyes:     Extraocular Movements: Extraocular movements intact.     Conjunctiva/sclera: Conjunctivae normal.     Pupils: Pupils are equal, round, and reactive to light.  Neck:     Vascular: No carotid bruit.  Cardiovascular:     Rate and Rhythm: Normal rate and regular rhythm.     Pulses: Normal pulses.     Heart sounds: Normal heart sounds. No murmur heard.    No friction rub. No gallop.  Pulmonary:     Effort: Pulmonary effort is normal. No respiratory distress.     Breath sounds: Normal breath sounds. No stridor. No wheezing, rhonchi or rales.  Abdominal:     General: Abdomen is flat. Bowel sounds are normal. There is no distension.     Palpations: Abdomen is soft. There is no mass.     Tenderness: There is no abdominal tenderness. There is no guarding or rebound.     Hernia: No hernia is present.  Musculoskeletal:        General: No swelling, tenderness, deformity or signs of injury. Normal range of motion.     Cervical back: Neck supple. No rigidity or tenderness.     Right lower leg: No edema.     Left lower leg: No edema.  Lymphadenopathy:     Cervical: No cervical adenopathy.  Skin:    General: Skin is warm.     Coloration: Skin is not jaundiced or pale.     Findings: No bruising, erythema, lesion or rash.  Neurological:     General: No focal deficit present.     Mental Status: She is alert and oriented to person, place, and time. Mental status is at baseline.     Cranial Nerves: No cranial nerve deficit.     Sensory: No sensory deficit.     Motor: No weakness.     Coordination: Coordination normal.     Gait: Gait normal.     Deep Tendon Reflexes: Reflexes normal.  Psychiatric:        Mood and Affect: Mood normal.        Behavior: Behavior normal.        Thought Content: Thought  content normal.  Judgment: Judgment normal.           Assessment & Plan:  General medical exam - Plan: DG Bone Density, MM Digital Screening, CBC with Differential/Platelet, Lipid panel, COMPLETE METABOLIC PANEL WITH GFR, Protein / Creatinine Ratio, Urine  Stage 3 chronic kidney disease, unspecified whether stage 3a or 3b CKD (HCC) - Plan: CBC with Differential/Platelet, Lipid panel, COMPLETE METABOLIC PANEL WITH GFR, Protein / Creatinine Ratio, Urine  Postmenopausal estrogen deficiency - Plan: DG Bone Density  Encounter for screening mammogram for malignant neoplasm of breast - Plan: MM Digital Screening  Actinic keratosis - Plan: Ambulatory referral to Dermatology I will schedule patient for mammogram.  I will schedule her for a bone density.  I will consult dermatology regarding the lesions on her face that were discussed above.  We discussed options for treatment for insomnia and she would like to try a low-dose Xanax 0.5 mg p.o. nightly as needed.  She will take the medication after she wakes up if she has a hard time falling back asleep.  I feel that this medicine will most quickly leave her system without any long-term effects on balance and memory the following morning.  Check CBC CMP lipid panel and urine protein to creatinine ratio

## 2022-05-05 ENCOUNTER — Other Ambulatory Visit: Payer: Self-pay

## 2022-05-05 LAB — CBC WITH DIFFERENTIAL/PLATELET
Absolute Monocytes: 380 cells/uL (ref 200–950)
Basophils Absolute: 21 cells/uL (ref 0–200)
Basophils Relative: 0.4 %
Eosinophils Absolute: 62 cells/uL (ref 15–500)
Eosinophils Relative: 1.2 %
HCT: 36.9 % (ref 35.0–45.0)
Hemoglobin: 12.6 g/dL (ref 11.7–15.5)
Lymphs Abs: 1160 cells/uL (ref 850–3900)
MCH: 34.4 pg — ABNORMAL HIGH (ref 27.0–33.0)
MCHC: 34.1 g/dL (ref 32.0–36.0)
MCV: 100.8 fL — ABNORMAL HIGH (ref 80.0–100.0)
MPV: 9.1 fL (ref 7.5–12.5)
Monocytes Relative: 7.3 %
Neutro Abs: 3578 cells/uL (ref 1500–7800)
Neutrophils Relative %: 68.8 %
Platelets: 295 10*3/uL (ref 140–400)
RBC: 3.66 10*6/uL — ABNORMAL LOW (ref 3.80–5.10)
RDW: 12.8 % (ref 11.0–15.0)
Total Lymphocyte: 22.3 %
WBC: 5.2 10*3/uL (ref 3.8–10.8)

## 2022-05-05 LAB — COMPLETE METABOLIC PANEL WITH GFR
AG Ratio: 1.9 (calc) (ref 1.0–2.5)
ALT: 7 U/L (ref 6–29)
AST: 12 U/L (ref 10–35)
Albumin: 4.4 g/dL (ref 3.6–5.1)
Alkaline phosphatase (APISO): 72 U/L (ref 37–153)
BUN/Creatinine Ratio: 17 (calc) (ref 6–22)
BUN: 18 mg/dL (ref 7–25)
CO2: 28 mmol/L (ref 20–32)
Calcium: 10.1 mg/dL (ref 8.6–10.4)
Chloride: 94 mmol/L — ABNORMAL LOW (ref 98–110)
Creat: 1.07 mg/dL — ABNORMAL HIGH (ref 0.60–0.95)
Globulin: 2.3 g/dL (calc) (ref 1.9–3.7)
Glucose, Bld: 91 mg/dL (ref 65–99)
Potassium: 4.7 mmol/L (ref 3.5–5.3)
Sodium: 130 mmol/L — ABNORMAL LOW (ref 135–146)
Total Bilirubin: 0.7 mg/dL (ref 0.2–1.2)
Total Protein: 6.7 g/dL (ref 6.1–8.1)
eGFR: 53 mL/min/{1.73_m2} — ABNORMAL LOW (ref >=60)

## 2022-05-05 LAB — LIPID PANEL
Cholesterol: 193 mg/dL (ref <200)
HDL: 46 mg/dL — ABNORMAL LOW (ref >=50)
LDL Cholesterol (Calc): 121 mg/dL (calc) — ABNORMAL HIGH
Non-HDL Cholesterol (Calc): 147 mg/dL (calc) — ABNORMAL HIGH (ref <130)
Total CHOL/HDL Ratio: 4.2 (calc) (ref <5.0)
Triglycerides: 138 mg/dL (ref <150)

## 2022-05-05 LAB — PROTEIN / CREATININE RATIO, URINE
Creatinine, Urine: 87 mg/dL (ref 20–275)
Protein/Creat Ratio: 184 mg/g creat (ref 24–184)
Protein/Creatinine Ratio: 0.184 mg/mg creat (ref 0.024–0.184)
Total Protein, Urine: 16 mg/dL (ref 5–24)

## 2022-05-05 MED ORDER — AMLODIPINE BESYLATE 10 MG PO TABS: 10.0000 mg | ORAL_TABLET | Freq: Every day | ORAL | 1 refills | Status: DC

## 2022-05-05 MED ORDER — LOSARTAN POTASSIUM 100 MG PO TABS: 100.0000 mg | ORAL_TABLET | Freq: Every day | ORAL | 1 refills | Status: DC

## 2022-05-09 ENCOUNTER — Ambulatory Visit
Admission: RE | Admit: 2022-05-09 | Discharge: 2022-05-09 | Disposition: A | Discharge status: Home / Self Care | Payer: Medicare HMO | Source: Ambulatory Visit | Attending: Family Medicine | Admitting: Family Medicine

## 2022-05-09 DIAGNOSIS — Z1231 Encounter for screening mammogram for malignant neoplasm of breast: Secondary | ICD-10-CM | POA: Diagnosis not present

## 2022-05-09 DIAGNOSIS — Z0000 Encounter for general adult medical examination without abnormal findings: Secondary | ICD-10-CM

## 2022-05-11 ENCOUNTER — Other Ambulatory Visit: Payer: Self-pay | Admitting: Family Medicine

## 2022-05-11 DIAGNOSIS — R928 Other abnormal and inconclusive findings on diagnostic imaging of breast: Secondary | ICD-10-CM

## 2022-05-16 ENCOUNTER — Other Ambulatory Visit: Payer: Medicare HMO

## 2022-05-19 ENCOUNTER — Ambulatory Visit
Admission: RE | Admit: 2022-05-19 | Discharge: 2022-05-19 | Disposition: A | Discharge status: Home / Self Care | Payer: Medicare HMO | Source: Ambulatory Visit | Attending: Family Medicine | Admitting: Family Medicine

## 2022-05-19 ENCOUNTER — Ambulatory Visit: Admission: RE | Admit: 2022-05-19 | Payer: Medicare HMO | Source: Ambulatory Visit

## 2022-05-19 DIAGNOSIS — R928 Other abnormal and inconclusive findings on diagnostic imaging of breast: Secondary | ICD-10-CM | POA: Diagnosis not present

## 2022-06-14 ENCOUNTER — Other Ambulatory Visit: Payer: Self-pay | Admitting: Family Medicine

## 2022-06-15 NOTE — Telephone Encounter (Signed)
Requested medication (s) are due for refill today - yes  Requested medication (s) are on the active medication list -yes  Future visit scheduled -no  Last refill: 05/04/22 #30  Notes to clinic: non delegated Rx, fails visit protocol  Requested Prescriptions  Pending Prescriptions Disp Refills   ALPRAZolam (XANAX) 0.5 MG tablet [Pharmacy Med Name: ALPRAZOLAM 0.5 MG TAB] 30 tablet     Sig: TAKE 1 TABLET BY MOUTH AT BEDTIME AS NEEDED FOR ANXIETY OR SLEEP     Not Delegated - Psychiatry: Anxiolytics/Hypnotics 2 Failed - 06/14/2022 11:01 AM      Failed - This refill cannot be delegated      Failed - Urine Drug Screen completed in last 360 days      Failed - Valid encounter within last 6 months    Recent Outpatient Visits           1 year ago General medical exam   [place] Susy Frizzle, MD   2 years ago Postmenopausal estrogen deficiency   [place] Dennard Schaumann, Cammie Mcgee, MD   4 years ago Acute bacterial rhinosinusitis   [place] Dennard Schaumann, Cammie Mcgee, MD   4 years ago Needs flu shot   [place] Pickard, Cammie Mcgee, MD   5 years ago Hospital discharge follow-up   [place], Cammie Mcgee, MD              Passed - Patient is not pregnant         Requested Prescriptions  Pending Prescriptions Disp Refills   ALPRAZolam (XANAX) 0.5 MG tablet [Pharmacy Med Name: ALPRAZOLAM 0.5 MG TAB] 30 tablet     Sig: TAKE 1 TABLET BY MOUTH AT BEDTIME AS NEEDED FOR ANXIETY OR SLEEP     Not Delegated - Psychiatry: Anxiolytics/Hypnotics 2 Failed - 06/14/2022 11:01 AM      Failed - This refill cannot be delegated      Failed - Urine Drug Screen completed in last 360 days      Failed - Valid encounter within last 6 months    Recent Outpatient Visits           1 year ago General medical exam   [place] Susy Frizzle, MD   2 years ago Postmenopausal estrogen deficiency    [place] Susy Frizzle, MD   4 years ago Acute bacterial rhinosinusitis   [place] Dennard Schaumann, Cammie Mcgee, MD   4 years ago Needs flu shot   [place], Cammie Mcgee, MD   5 years ago Hospital discharge follow-up   [place], Cammie Mcgee, MD              Passed - Patient is not pregnant

## 2022-06-19 DIAGNOSIS — L570 Actinic keratosis: Secondary | ICD-10-CM | POA: Diagnosis not present

## 2022-06-23 ENCOUNTER — Telehealth: Payer: Self-pay

## 2022-06-23 NOTE — Telephone Encounter (Signed)
Pt called stating she is having swelling in her ankles, that she didn't have before, since the change in her BP medication. Pt asks if she should go back to other med? Thank you.

## 2022-06-27 ENCOUNTER — Other Ambulatory Visit: Payer: Self-pay | Admitting: Family Medicine

## 2022-06-27 MED ORDER — DOXAZOSIN MESYLATE 2 MG PO TABS: 2.0000 mg | ORAL_TABLET | Freq: Every day | ORAL | 3 refills | Status: DC

## 2022-07-17 ENCOUNTER — Other Ambulatory Visit: Payer: Self-pay | Admitting: Family Medicine

## 2022-07-18 ENCOUNTER — Other Ambulatory Visit: Payer: Self-pay | Admitting: Family Medicine

## 2022-07-24 ENCOUNTER — Emergency Department (HOSPITAL_COMMUNITY): Payer: Medicare HMO

## 2022-07-24 ENCOUNTER — Inpatient Hospital Stay (HOSPITAL_COMMUNITY)
Admission: EM | Admit: 2022-07-24 | Discharge: 2022-07-27 | DRG: 640 | Disposition: A | Discharge status: Home Health | Payer: Medicare HMO | Attending: Internal Medicine | Admitting: Internal Medicine

## 2022-07-24 ENCOUNTER — Other Ambulatory Visit: Payer: Self-pay

## 2022-07-24 ENCOUNTER — Encounter (HOSPITAL_COMMUNITY): Payer: Self-pay

## 2022-07-24 DIAGNOSIS — Z7982 Long term (current) use of aspirin: Secondary | ICD-10-CM

## 2022-07-24 DIAGNOSIS — N1831 Chronic kidney disease, stage 3a: Secondary | ICD-10-CM | POA: Diagnosis not present

## 2022-07-24 DIAGNOSIS — R197 Diarrhea, unspecified: Secondary | ICD-10-CM | POA: Diagnosis present

## 2022-07-24 DIAGNOSIS — E860 Dehydration: Principal | ICD-10-CM | POA: Diagnosis present

## 2022-07-24 DIAGNOSIS — T502X5A Adverse effect of carbonic-anhydrase inhibitors, benzothiadiazides and other diuretics, initial encounter: Secondary | ICD-10-CM | POA: Diagnosis present

## 2022-07-24 DIAGNOSIS — F419 Anxiety disorder, unspecified: Secondary | ICD-10-CM | POA: Diagnosis not present

## 2022-07-24 DIAGNOSIS — R55 Syncope and collapse: Secondary | ICD-10-CM | POA: Diagnosis not present

## 2022-07-24 DIAGNOSIS — Z91040 Latex allergy status: Secondary | ICD-10-CM

## 2022-07-24 DIAGNOSIS — Z880 Allergy status to penicillin: Secondary | ICD-10-CM | POA: Diagnosis not present

## 2022-07-24 DIAGNOSIS — Z881 Allergy status to other antibiotic agents status: Secondary | ICD-10-CM

## 2022-07-24 DIAGNOSIS — Z79899 Other long term (current) drug therapy: Secondary | ICD-10-CM | POA: Diagnosis not present

## 2022-07-24 DIAGNOSIS — R053 Chronic cough: Secondary | ICD-10-CM | POA: Diagnosis present

## 2022-07-24 DIAGNOSIS — U071 COVID-19: Secondary | ICD-10-CM | POA: Diagnosis not present

## 2022-07-24 DIAGNOSIS — Z8249 Family history of ischemic heart disease and other diseases of the circulatory system: Secondary | ICD-10-CM | POA: Diagnosis not present

## 2022-07-24 DIAGNOSIS — R112 Nausea with vomiting, unspecified: Secondary | ICD-10-CM | POA: Diagnosis present

## 2022-07-24 DIAGNOSIS — R001 Bradycardia, unspecified: Secondary | ICD-10-CM | POA: Diagnosis not present

## 2022-07-24 DIAGNOSIS — Z882 Allergy status to sulfonamides status: Secondary | ICD-10-CM | POA: Diagnosis not present

## 2022-07-24 DIAGNOSIS — I129 Hypertensive chronic kidney disease with stage 1 through stage 4 chronic kidney disease, or unspecified chronic kidney disease: Secondary | ICD-10-CM | POA: Diagnosis not present

## 2022-07-24 DIAGNOSIS — Z821 Family history of blindness and visual loss: Secondary | ICD-10-CM | POA: Diagnosis not present

## 2022-07-24 DIAGNOSIS — J45909 Unspecified asthma, uncomplicated: Secondary | ICD-10-CM | POA: Diagnosis not present

## 2022-07-24 DIAGNOSIS — E871 Hypo-osmolality and hyponatremia: Secondary | ICD-10-CM | POA: Diagnosis present

## 2022-07-24 DIAGNOSIS — I10 Essential (primary) hypertension: Secondary | ICD-10-CM | POA: Diagnosis present

## 2022-07-24 DIAGNOSIS — R630 Anorexia: Secondary | ICD-10-CM | POA: Diagnosis present

## 2022-07-24 DIAGNOSIS — R0902 Hypoxemia: Secondary | ICD-10-CM | POA: Diagnosis not present

## 2022-07-24 DIAGNOSIS — I959 Hypotension, unspecified: Secondary | ICD-10-CM | POA: Diagnosis not present

## 2022-07-24 DIAGNOSIS — R531 Weakness: Secondary | ICD-10-CM | POA: Diagnosis present

## 2022-07-24 LAB — CBC WITH DIFFERENTIAL/PLATELET
Abs Immature Granulocytes: 0.03 10*3/uL (ref 0.00–0.07)
Basophils Absolute: 0 10*3/uL (ref 0.0–0.1)
Basophils Relative: 0 %
Eosinophils Absolute: 0 10*3/uL (ref 0.0–0.5)
Eosinophils Relative: 0 %
HCT: 32.5 % — ABNORMAL LOW (ref 36.0–46.0)
Hemoglobin: 12.1 g/dL (ref 12.0–15.0)
Immature Granulocytes: 1 %
Lymphocytes Relative: 19 %
Lymphs Abs: 0.7 10*3/uL (ref 0.7–4.0)
MCH: 34.6 pg — ABNORMAL HIGH (ref 26.0–34.0)
MCHC: 37.2 g/dL — ABNORMAL HIGH (ref 30.0–36.0)
MCV: 92.9 fL (ref 80.0–100.0)
Monocytes Absolute: 0.3 10*3/uL (ref 0.1–1.0)
Monocytes Relative: 9 %
Neutro Abs: 2.8 10*3/uL (ref 1.7–7.7)
Neutrophils Relative %: 71 %
Platelets: 285 10*3/uL (ref 150–400)
RBC: 3.5 MIL/uL — ABNORMAL LOW (ref 3.87–5.11)
RDW: 11.9 % (ref 11.5–15.5)
WBC: 4 10*3/uL (ref 4.0–10.5)
nRBC: 0 % (ref 0.0–0.2)

## 2022-07-24 LAB — COMPREHENSIVE METABOLIC PANEL
ALT: 12 U/L (ref 0–44)
AST: 18 U/L (ref 15–41)
Albumin: 4 g/dL (ref 3.5–5.0)
Alkaline Phosphatase: 59 U/L (ref 38–126)
Anion gap: 14 (ref 5–15)
BUN: 16 mg/dL (ref 8–23)
CO2: 19 mmol/L — ABNORMAL LOW (ref 22–32)
Calcium: 9.4 mg/dL (ref 8.9–10.3)
Chloride: 86 mmol/L — ABNORMAL LOW (ref 98–111)
Creatinine, Ser: 1.12 mg/dL — ABNORMAL HIGH (ref 0.44–1.00)
GFR, Estimated: 50 mL/min — ABNORMAL LOW (ref >60)
Glucose, Bld: 122 mg/dL — ABNORMAL HIGH (ref 70–99)
Potassium: 3.4 mmol/L — ABNORMAL LOW (ref 3.5–5.1)
Sodium: 119 mmol/L — CL (ref 135–145)
Total Bilirubin: 1.2 mg/dL (ref 0.3–1.2)
Total Protein: 6.6 g/dL (ref 6.5–8.1)

## 2022-07-24 LAB — RESP PANEL BY RT-PCR (FLU A&B, COVID) ARPGX2
Influenza A by PCR: NEGATIVE
Influenza B by PCR: NEGATIVE
SARS Coronavirus 2 by RT PCR: POSITIVE — AB

## 2022-07-24 LAB — URINALYSIS, ROUTINE W REFLEX MICROSCOPIC
Bilirubin Urine: NEGATIVE
Glucose, UA: NEGATIVE mg/dL
Hgb urine dipstick: NEGATIVE
Ketones, ur: NEGATIVE mg/dL
Leukocytes,Ua: NEGATIVE
Nitrite: NEGATIVE
Protein, ur: NEGATIVE mg/dL
Specific Gravity, Urine: 1.005 (ref 1.005–1.030)
pH: 6 (ref 5.0–8.0)

## 2022-07-24 LAB — BASIC METABOLIC PANEL
Anion gap: 6 (ref 5–15)
BUN: 15 mg/dL (ref 8–23)
CO2: 18 mmol/L — ABNORMAL LOW (ref 22–32)
Calcium: 7.1 mg/dL — ABNORMAL LOW (ref 8.9–10.3)
Chloride: 101 mmol/L (ref 98–111)
Creatinine, Ser: 0.88 mg/dL (ref 0.44–1.00)
GFR, Estimated: >60 mL/min (ref >60)
Glucose, Bld: 81 mg/dL (ref 70–99)
Potassium: 2.9 mmol/L — ABNORMAL LOW (ref 3.5–5.1)
Sodium: 125 mmol/L — ABNORMAL LOW (ref 135–145)

## 2022-07-24 LAB — LACTIC ACID, PLASMA: Lactic Acid, Venous: 2.1 mmol/L (ref 0.5–1.9)

## 2022-07-24 LAB — I-STAT CHEM 8, ED
BUN: 23 mg/dL (ref 8–23)
Calcium, Ion: 1.09 mmol/L — ABNORMAL LOW (ref 1.15–1.40)
Chloride: 89 mmol/L — ABNORMAL LOW (ref 98–111)
Creatinine, Ser: 1.1 mg/dL — ABNORMAL HIGH (ref 0.44–1.00)
Glucose, Bld: 112 mg/dL — ABNORMAL HIGH (ref 70–99)
HCT: 41 % (ref 36.0–46.0)
Hemoglobin: 13.9 g/dL (ref 12.0–15.0)
Potassium: 3.4 mmol/L — ABNORMAL LOW (ref 3.5–5.1)
Sodium: 120 mmol/L — ABNORMAL LOW (ref 135–145)
TCO2: 21 mmol/L — ABNORMAL LOW (ref 22–32)

## 2022-07-24 LAB — PROTIME-INR
INR: 1 (ref 0.8–1.2)
Prothrombin Time: 13.2 seconds (ref 11.4–15.2)

## 2022-07-24 LAB — TROPONIN I (HIGH SENSITIVITY)
Troponin I (High Sensitivity): 20 ng/L — ABNORMAL HIGH (ref <18)
Troponin I (High Sensitivity): 19 ng/L — ABNORMAL HIGH (ref <18)

## 2022-07-24 LAB — PHOSPHORUS: Phosphorus: 2.6 mg/dL (ref 2.5–4.6)

## 2022-07-24 LAB — MAGNESIUM: Magnesium: 1.4 mg/dL — ABNORMAL LOW (ref 1.7–2.4)

## 2022-07-24 MED ORDER — METOPROLOL TARTRATE 25 MG PO TABS
25.0000 mg | ORAL_TABLET | Freq: Two times a day (BID) | ORAL | Status: DC
  Administered 2022-07-24 – 2022-07-26 (×4): 25 mg via ORAL
  Filled 2022-07-24 (×4): qty 1

## 2022-07-24 MED ORDER — POTASSIUM CHLORIDE 10 MEQ/100ML IV SOLN
10.0000 meq | Freq: Once | INTRAVENOUS | Status: AC
  Administered 2022-07-24: 10 meq via INTRAVENOUS
  Filled 2022-07-24: qty 100

## 2022-07-24 MED ORDER — ENOXAPARIN SODIUM 40 MG/0.4ML IJ SOSY
40.0000 mg | PREFILLED_SYRINGE | INTRAMUSCULAR | Status: DC
  Administered 2022-07-24 – 2022-07-26 (×3): 40 mg via SUBCUTANEOUS
  Filled 2022-07-24 (×3): qty 0.4

## 2022-07-24 MED ORDER — GUAIFENESIN ER 600 MG PO TB12
600.0000 mg | ORAL_TABLET | Freq: Two times a day (BID) | ORAL | Status: DC | PRN
  Administered 2022-07-25 – 2022-07-27 (×3): 600 mg via ORAL
  Filled 2022-07-24 (×3): qty 1

## 2022-07-24 MED ORDER — SODIUM CHLORIDE 0.9 % IV BOLUS
1000.0000 mL | Freq: Once | INTRAVENOUS | Status: AC
  Administered 2022-07-24: 1000 mL via INTRAVENOUS

## 2022-07-24 MED ORDER — ASPIRIN 81 MG PO TABS: 81.0000 mg | ORAL_TABLET | Freq: Every day | ORAL | Status: DC

## 2022-07-24 MED ORDER — ASPIRIN 81 MG PO TBEC
81.0000 mg | DELAYED_RELEASE_TABLET | Freq: Every day | ORAL | Status: DC
  Administered 2022-07-25 – 2022-07-27 (×3): 81 mg via ORAL
  Filled 2022-07-24 (×3): qty 1

## 2022-07-24 MED ORDER — ALBUTEROL SULFATE (2.5 MG/3ML) 0.083% IN NEBU
2.5000 mg | INHALATION_SOLUTION | Freq: Four times a day (QID) | RESPIRATORY_TRACT | Status: DC | PRN
  Filled 2022-07-24: qty 3

## 2022-07-24 MED ORDER — SODIUM CHLORIDE 0.9 % IV SOLN: Freq: Once | INTRAVENOUS | Status: AC

## 2022-07-24 MED ORDER — ALPRAZOLAM 0.5 MG PO TABS
0.5000 mg | ORAL_TABLET | Freq: Every evening | ORAL | Status: DC | PRN
  Administered 2022-07-24 – 2022-07-25 (×2): 0.5 mg via ORAL
  Filled 2022-07-24: qty 2
  Filled 2022-07-24: qty 1

## 2022-07-24 MED ORDER — ALBUTEROL SULFATE HFA 108 (90 BASE) MCG/ACT IN AERS: 2.0000 | INHALATION_SPRAY | Freq: Four times a day (QID) | RESPIRATORY_TRACT | Status: DC | PRN

## 2022-07-24 MED ORDER — HYDRALAZINE HCL 20 MG/ML IJ SOLN
5.0000 mg | INTRAMUSCULAR | Status: DC | PRN
  Administered 2022-07-26: 5 mg via INTRAVENOUS
  Filled 2022-07-24: qty 1

## 2022-07-24 MED ORDER — ONDANSETRON HCL 4 MG/2ML IJ SOLN: 4.0000 mg | Freq: Four times a day (QID) | INTRAMUSCULAR | Status: DC | PRN

## 2022-07-24 MED ORDER — MAGNESIUM SULFATE 2 GM/50ML IV SOLN
2.0000 g | Freq: Once | INTRAVENOUS | Status: AC
  Administered 2022-07-24: 2 g via INTRAVENOUS
  Filled 2022-07-24: qty 50

## 2022-07-24 MED ORDER — SODIUM CHLORIDE 0.9% FLUSH
3.0000 mL | Freq: Two times a day (BID) | INTRAVENOUS | Status: DC
  Administered 2022-07-26 – 2022-07-27 (×3): 3 mL via INTRAVENOUS

## 2022-07-24 MED ORDER — ACETAMINOPHEN 325 MG PO TABS
650.0000 mg | ORAL_TABLET | Freq: Four times a day (QID) | ORAL | Status: DC | PRN
  Administered 2022-07-24 – 2022-07-26 (×4): 650 mg via ORAL
  Filled 2022-07-24 (×4): qty 2

## 2022-07-24 MED ORDER — ACETAMINOPHEN 650 MG RE SUPP: 650.0000 mg | Freq: Four times a day (QID) | RECTAL | Status: DC | PRN

## 2022-07-24 MED ORDER — LORATADINE 10 MG PO TABS
10.0000 mg | ORAL_TABLET | Freq: Every day | ORAL | Status: DC
  Administered 2022-07-25 – 2022-07-27 (×3): 10 mg via ORAL
  Filled 2022-07-24 (×3): qty 1

## 2022-07-24 MED ORDER — ONDANSETRON HCL 4 MG PO TABS: 4.0000 mg | ORAL_TABLET | Freq: Four times a day (QID) | ORAL | Status: DC | PRN

## 2022-07-24 MED ORDER — LOSARTAN POTASSIUM 50 MG PO TABS
100.0000 mg | ORAL_TABLET | Freq: Every day | ORAL | Status: DC
  Administered 2022-07-27: 100 mg via ORAL
  Filled 2022-07-24 (×3): qty 2

## 2022-07-24 MED ORDER — POTASSIUM CHLORIDE CRYS ER 20 MEQ PO TBCR
40.0000 meq | EXTENDED_RELEASE_TABLET | Freq: Once | ORAL | Status: AC
  Administered 2022-07-24: 40 meq via ORAL
  Filled 2022-07-24: qty 2

## 2022-07-24 NOTE — ED Notes (Signed)
Pt repositioned for comfort, family at bedside. MD at bedside.

## 2022-07-24 NOTE — ED Notes (Signed)
Family at bedside. 

## 2022-07-24 NOTE — ED Notes (Signed)
Lactic acid 2.1 MD made aware.

## 2022-07-24 NOTE — ED Provider Notes (Signed)
[facility] EMERGENCY DEPARTMENT Provider Note   CSN: [id number] Arrival date & time: 07/24/22  1105     History {Add pertinent medical, surgical, social history, OB history to HPI:1} No chief complaint on file.   Yvonne Rich is a 81 y.o. female.  81 year old female with prior medical history as detailed below presents for evaluation.  Patient reports that she was diagnosed with COVID last Sunday (this was 8 days prior to today).  She reports significant weakness and fatigue since this diagnosis.  She reports that she took a Z-Pak and ivermectin for treatment of her COVID.  She denies use of antiviral medications.  She reports that she has been vaccinated for COVID.  Patient apparently was attempting to go to her PCPs office today for an evaluation.  She was too weak to ambulate.  She apparently passed out with standing.  EMS noted that she was moderately bradycardic - without hypotension - on scene.  They report heart rates down into the 40s.  They gave the patient 1 mg of atropine.  They also gave her a normal saline bolus.  Patient complains of generalized weakness and mild nausea.  She denies chest pain or shortness of breath.    The history is provided by the patient and medical records.       Home Medications Prior to Admission medications   Medication Sig Start Date End Date Taking? Authorizing Provider  ALPRAZolam Duanne Moron) 0.5 MG tablet TAKE 1 TABLET BY MOUTH AT BEDTIME AS NEEDED FOR ANXIETY OR SLEEP 07/18/22   Susy Frizzle, MD  acetaminophen (TYLENOL) 500 MG tablet Take 1,000 mg by mouth every 6 (six) hours as needed for mild pain.    [provider]  albuterol (PROAIR HFA) 108 (90 BASE) MCG/ACT inhaler Inhale 2 puffs into the lungs every 6 (six) hours as needed for wheezing or shortness of breath. Patient not taking: Reported on 05/04/2022 10/05/15   Susy Frizzle, MD  aspirin 81 MG tablet Take 81 mg by mouth daily.     [provider]  doxazosin (CARDURA) 2 MG tablet Take 1 tablet (2 mg total) by mouth daily. Stop amlodipine 06/27/22   Susy Frizzle, MD  loratadine (CLARITIN) 10 MG tablet Take 10 mg by mouth daily.    [provider]  losartan (COZAAR) 100 MG tablet Take 1 tablet (100 mg total) by mouth daily. 05/05/22   Susy Frizzle, MD  losartan-hydrochlorothiazide (HYZAAR) 100-25 MG tablet TAKE 1 TABLET BY MOUTH ONCE DAILY 06/27/22   Susy Frizzle, MD  meclizine (ANTIVERT) 25 MG tablet Take 1 tablet (25 mg total) by mouth 3 (three) times daily as needed for dizziness. 12/15/16   Susy Frizzle, MD  metoprolol tartrate (LOPRESSOR) 25 MG tablet TAKE ONE TABLET BY MOUTH TWICE A DAY 07/17/22   Susy Frizzle, MD  Vitamin D, Cholecalciferol, 1000 units CAPS Take 2,000 Units by mouth daily.     [provider]      Allergies    Latex, Penicillins, Erythromycin, and Sulfonamide derivatives    Review of Systems   Review of Systems  All other systems reviewed and are negative.   Physical Exam Updated Vital Signs BP (!) 142/96 (BP Location: Right Arm)   Pulse 81   Resp 17   SpO2 100%  Physical Exam Vitals and nursing note reviewed.  Constitutional:      General: She is not in acute distress.    Appearance: Normal appearance. She  is well-developed.  HENT:     Head: Normocephalic and atraumatic.     Mouth/Throat:     Mouth: Mucous membranes are dry.  Eyes:     Conjunctiva/sclera: Conjunctivae normal.     Pupils: Pupils are equal, round, and reactive to light.  Cardiovascular:     Rate and Rhythm: Normal rate and regular rhythm.     Heart sounds: Normal heart sounds.  Pulmonary:     Effort: Pulmonary effort is normal. No respiratory distress.     Breath sounds: Normal breath sounds.  Abdominal:     General: There is no distension.     Palpations: Abdomen is soft.     Tenderness: There is no abdominal tenderness.  Musculoskeletal:        General: No  deformity. Normal range of motion.     Cervical back: Normal range of motion and neck supple.  Skin:    General: Skin is warm and dry.  Neurological:     General: No focal deficit present.     Mental Status: She is alert and oriented to person, place, and time.     ED Results / Procedures / Treatments   Labs (all labs ordered are listed, but only abnormal results are displayed) Labs Reviewed - No data to display  EKG None  Radiology No results found.  Procedures Procedures  {Document cardiac monitor, telemetry assessment procedure when appropriate:1}  Medications Ordered in ED Medications - No data to display  ED Course/ Medical Decision Making/ A&P                           Medical Decision Making Amount and/or Complexity of Data Reviewed Labs: ordered. Radiology: ordered.  Risk Prescription drug management.    Medical Screen Complete  This patient presented to the ED with complaint of weakness, syncope.  This complaint involves an extensive number of treatment options. The initial differential diagnosis includes, but is not limited to, infection, metabolic abnormality  This presentation is: Acute, Chronic, Self-Limited, Previously Undiagnosed, Uncertain Prognosis, Complicated, Systemic Symptoms, and Threat to Life/Bodily Function  Patient is presenting with complaint of weakness and associated syncope.  Patient with recently diagnosed COVID approximately 2 to 3 weeks ago.  Patient with decreased p.o. intake for the last several days.  Reports that she did not take antiviral medications.  She reports that she took both a Z-Pak and ivermectin.  Patient with history of mild hyponatremia in the past.  She cannot quantitate this today.  Work-up today is concerning for sodium of 119.  Magnesium is 1.4.  Potassium is 3.4.  Creatinine is 1.1.  Discussed with Critical Care.  They requested completion of 2 L of normal saline bolus.  They requested repeat BMP after  bolus completion.  If sodium is still 120 or less they will evaluate the patient for likely admission.  Otherwise, patient is appropriate for medicine admission.   Additional history obtained: External records from outside sources obtained and reviewed including prior ED visits and prior Inpatient records.    Lab Tests:  I ordered and personally interpreted labs.  The pertinent results include: CBC, i-STAT Chem-8, lactic acid, CMP, magnesium, phosphorus   Imaging Studies ordered:  I ordered imaging studies including chest xray  I independently visualized and interpreted obtained imaging which showed NAD I agree with the radiologist interpretation.   Cardiac Monitoring:  The patient was maintained on a cardiac monitor.  I personally viewed and interpreted the  cardiac monitor which showed an underlying rhythm of: NSR   Medicines ordered:  I ordered medication including IVF, K, Mag  for dehydration, electrolyte replacement  Reevaluation of the patient after these medicines showed that the patient: improved     Problem List / ED Course:  Hyponatremia, hypokalemia, hypomagnesia,    Reevaluation:  After the interventions noted above, I reevaluated the patient and found that they have: improved   Disposition:  After consideration of the diagnostic results and the patients response to treatment, I feel that the patent would benefit from admission.    {Document critical care time when appropriate:1} {Document review of labs and clinical decision tools ie heart score, Chads2Vasc2 etc:1}  {Document your independent review of radiology images, and any outside records:1} {Document your discussion with family members, caretakers, and with consultants:1} {Document social determinants of health affecting pt's care:1} {Document your decision making why or why not admission, treatments were needed:1} Final Clinical Impression(s) / ED Diagnoses Final diagnoses:  None    Rx / DC  Orders ED Discharge Orders     None

## 2022-07-24 NOTE — ED Notes (Addendum)
Critical Na MD made aware.

## 2022-07-24 NOTE — ED Notes (Signed)
Lab add on for magnesium and phosphorous.

## 2022-07-24 NOTE — H&P (Signed)
History and Physical    Patient: Yvonne Rich ZTI:[id number] DOB: 1940-11-08 DOA: 07/24/2022 DOS: the patient was seen and examined on 07/24/2022 PCP: Susy Frizzle, MD  Patient coming from: Home - lives home; [facility]: Daughter, [phone]   Chief Complaint: recurrent syncope  HPI: Yvonne Rich is a 81 y.o. female with medical history significant of stage 3 CKD, HTN, and glaucoma presenting with recurrent syncope.  She had COVID and has had very little activity for a week.  Her daughter went in to get her breakfast this AM and she was sitting in a chair in the bathroom and was slumped, didn't respond.  She was awake, not unconscious, moved a bit.  She started to come around and was answering questions but had another episode with EMS.  She never fully lost consciousness.  Her test was positive on 10/8; the week before that she had cough and drainage but started feeling bad on 10/8.  Monday she felt a little better.  She had a telehealth visit with Dr. Jimmye Norman in [facility] Monday.  He started Z-pack and Ivermectin.  She had decreased activity and then started meds on Tuesday-Wednesday.  She developed diarrhea which she thought was related to the Z-pack.  No SOB.  She is still coughing.  No fever, + headache.  Her daughter has been making her eat and drink, seemed to do better the last day or two.  She has continued her metoprolol and losartan-HCTZ throughout.  She has not been taking doxazosin.    ER Course:  Hyponatremia, near syncope today.  Na++ 119.  ?due to HCTZ.  ICU recommends monitoring on progressive.  HR in 20s with EMS, given Atropine.  HR ok while here.  COVID 8 days ago - took Zpack and Ivermectin.       Review of Systems: As mentioned in the history of present illness. All other systems reviewed and are negative. Past Medical History:  Diagnosis Date   Allergy    Asthma    CKD (chronic kidney disease) stage 3, GFR 30-59 ml/min (HCC)    Glaucoma    Hypertension     Poliomyelitis    Sciatica of right side    Past Surgical History:  Procedure Laterality Date   CHOLECYSTECTOMY  2002   COSMETIC SURGERY  2012   eye lids   [place]   Social History:  reports that she has never smoked. She has never used smokeless tobacco. She reports that she does not drink alcohol and does not use drugs.  Allergies  Allergen Reactions   Latex Rash   Penicillins Other (See Comments)    Breaking out around mouth   Erythromycin Rash   Sulfonamide Derivatives Rash    Family History  Problem Relation Age of Onset   Hypertension Father    Vision loss Father     Prior to Admission medications   Medication Sig Start Date End Date Taking? Authorizing Provider  acetaminophen (TYLENOL) 500 MG tablet Take 1,000 mg by mouth every 6 (six) hours as needed for mild pain.   Yes [provider]  albuterol (PROAIR HFA) 108 (90 BASE) MCG/ACT inhaler Inhale 2 puffs into the lungs every 6 (six) hours as needed for wheezing or shortness of breath. 10/05/15  Yes Susy Frizzle, MD  ALPRAZolam Duanne Moron) 0.5 MG tablet TAKE 1 TABLET BY MOUTH AT BEDTIME AS NEEDED FOR ANXIETY OR SLEEP Patient taking differently: Take 0.5 mg by mouth at bedtime as needed for  anxiety or sleep. 07/18/22  Yes Susy Frizzle, MD  aspirin 81 MG tablet Take 81 mg by mouth daily.   Yes [provider]  cyanocobalamin (VITAMIN B12) 1000 MCG tablet Take 1,000 mcg by mouth daily.   Yes [provider]  dimenhyDRINATE (DRAMAMINE) 50 MG tablet Take 50 mg by mouth every 8 (eight) hours as needed for nausea.   Yes [provider]  guaiFENesin (MUCINEX) 600 MG 12 hr tablet Take 600 mg by mouth 2 (two) times daily as needed for cough.   Yes [provider]  ivermectin (STROMECTOL) 3 MG TABS tablet Take 9 mg by mouth daily. X 7 days  started on 07-17-22   Yes [provider]  loratadine (CLARITIN) 10 MG tablet Take 10 mg by mouth daily.   Yes  [provider]  losartan-hydrochlorothiazide (HYZAAR) 100-25 MG tablet TAKE 1 TABLET BY MOUTH ONCE DAILY 06/27/22  Yes Susy Frizzle, MD  metoprolol tartrate (LOPRESSOR) 25 MG tablet TAKE ONE TABLET BY MOUTH TWICE A DAY 07/17/22  Yes Susy Frizzle, MD  oxymetazoline (AFRIN) 0.05 % nasal spray Place 1 spray into both nostrils 2 (two) times daily as needed for congestion.   Yes [provider]  Throat Lozenges (COUGH DROPS MT) Use as directed 1 lozenge in the mouth or throat 3 (three) times daily as needed (cough).   Yes [provider]  Vitamin D, Cholecalciferol, 1000 units CAPS Take 2,000 Units by mouth daily.    Yes [provider]  azithromycin (ZITHROMAX) 250 MG tablet Take 250 mg by mouth as directed. 07/17/22   [provider]  doxazosin (CARDURA) 2 MG tablet Take 1 tablet (2 mg total) by mouth daily. Stop amlodipine Patient not taking: Reported on 07/24/2022 06/27/22   Susy Frizzle, MD  losartan (COZAAR) 100 MG tablet Take 1 tablet (100 mg total) by mouth daily. Patient not taking: Reported on 07/24/2022 05/05/22   Susy Frizzle, MD    Physical Exam: Vitals:   07/24/22 1500 07/24/22 1530 07/24/22 1745 07/24/22 1901  BP: (!) 146/74 (!) 147/67 (!) 152/57   Pulse: 63 63 69 71  Resp: '17 18 15 18  '$ Temp: 98.2 F (36.8 C)   98 F (36.7 C)  TempSrc:      SpO2: 100% 100% 100% 99%   General:  Appears calm and comfortable and is in NAD, frail Eyes:  EOMI, normal lids, iris ENT:   hard of hearing, grossly normal lips & tongue, mmm Neck:  no LAD, masses or thyromegaly Cardiovascular:  RRR, no m/r/g. No LE edema.  Respiratory:   CTA bilaterally with no wheezes/rales/rhonchi.  Normal respiratory effort. Abdomen:  soft, NT, ND Skin:  no rash or induration seen on limited exam Musculoskeletal:  grossly normal tone BUE/BLE, good ROM, no bony abnormality Psychiatric:  blunted mood and affect, speech fluent and appropriate,  AOx3 Neurologic:  CN 2-12 grossly intact, moves all extremities in coordinated fashion   Radiological Exams on Admission: Independently reviewed - see discussion in A/P where applicable  DG Chest Port 1 View  Result Date: 07/24/2022 CLINICAL DATA:  Weakness.  COVID diagnosis EXAM: PORTABLE CHEST 1 VIEW COMPARISON:  None Available. FINDINGS: Normal mediastinum and cardiac silhouette. Normal pulmonary vasculature. No evidence of effusion, infiltrate, or pneumothorax. No acute bony abnormality. Atherosclerotic calcification of the aorta. IMPRESSION: 1.  No acute cardiopulmonary process. 2.  Aortic Atherosclerosis (ICD10-I70.0). Electronically Signed   By: Suzy Bouchard M.D.   On: 07/24/2022 11:51  EKG: pending   Labs on Admission: I have personally reviewed the available labs and imaging studies at the time of the admission.  Pertinent labs:   Na++ 119 -> 120 -> 125 K+ 2.9 CO 2 18 HS troponin 20, 19 Unremarkable CBC UA rare bacteria    Assessment and Plan: Principal Problem:   Near syncope Active Problems:   Essential hypertension   Hyponatremia   COVID-19 virus infection   Chronic kidney disease, stage 3a (HCC)    Near syncope -Patient with recently poor PO intake, COVID infection that was treated with Ivermectin presenting with recurrent near syncope -Found to have hyponatremia -This patient is at moderate/high risk for serious outcome and thus should be observed overnight on telemetry in the hospital. -Orthostatic vital signs now and in AM -Trending troponins x 2 - negative delta, unlikely ACS -Neuro checks  -PT/OT eval and treat  COVID-19 infection -Patient with persistent cough but negative CXR, 8 days out -Mild anorexia noted with diarrhea, last yesterday -She does not have a current O2 requirement  -COVID pending - will leave on precautions for now -The patient was treated with Ivermectin; there are high quality studies showing that this medication does not  work, and her current symptoms may be explained by use of this medication (can cause GI upset -> dehydration -> hyponatremia) -Continue albuterol, Mucinex  Hyponatremia -Etiology appears to be hypovolemic hyponatremia; patient with recent COVID symptoms and reported 7 days of decreased PO intake with diarrhea -This was likely exacerbated by ongoing use of HCTZ - will hold -Sodium increased from 119 to 125 based on boluses/IVF received in ER.   -In an effort to prevent overcorrection (>91mq/L/day so as to avoid central pontine myelinolysis), will stop IVF infusion at this time  HTN -Continue losartan, metoprolol -She has not yet started doxazosin  Stage 3a CKD -Appears to be stable at this time -Attempt to avoid nephrotoxic medications -Recheck BMP in AM   Anxiety -Continue alprazolam    Advance Care Planning:   Code Status: Full Code   Consults: TOC; nutrition; PT/OT  DVT Prophylaxis: Lovenox  Family Communication: Daughter and granddaughter were present throughout evaluation  Severity of Illness: The appropriate patient status for this patient is OBSERVATION. Observation status is judged to be reasonable and necessary in order to provide the required intensity of service to ensure the patient's safety. The patient's presenting symptoms, physical exam findings, and initial radiographic and laboratory data in the context of their medical condition is felt to place them at decreased risk for further clinical deterioration. Furthermore, it is anticipated that the patient will be medically stable for discharge from the hospital within 2 midnights of admission.   Author: JKarmen Bongo MD 07/24/2022 7:14 PM  For on call review [email or web address]

## 2022-07-24 NOTE — ED Provider Notes (Signed)
Care of patient assumed from Dr. Francia Greaves.  This patient presents for near syncopal episode and generalized weakness.  She is found to be hyponatremic.  Critical care advised repeating BMP after IV fluid replacement.  If sodium is greater than 120, she can go to floor. Physical Exam  BP (!) 147/67   Pulse 63   Temp 98.2 F (36.8 C)   Resp 18   SpO2 100%   Physical Exam Vitals and nursing note reviewed.  Constitutional:      General: She is not in acute distress.    Appearance: Normal appearance. She is well-developed. She is not ill-appearing, toxic-appearing or diaphoretic.  HENT:     Head: Normocephalic and atraumatic.     Right Ear: External ear normal.     Left Ear: External ear normal.     Nose: Nose normal.     Mouth/Throat:     Mouth: Mucous membranes are moist.     Pharynx: Oropharynx is clear.  Eyes:     Extraocular Movements: Extraocular movements intact.     Conjunctiva/sclera: Conjunctivae normal.  Cardiovascular:     Rate and Rhythm: Normal rate and regular rhythm.     Heart sounds: No murmur heard. Pulmonary:     Effort: Pulmonary effort is normal. No respiratory distress.  Abdominal:     General: There is no distension.     Palpations: Abdomen is soft.  Musculoskeletal:        General: Normal range of motion.     Cervical back: Normal range of motion and neck supple.  Skin:    General: Skin is warm and dry.  Neurological:     General: No focal deficit present.     Mental Status: She is alert and oriented to person, place, and time.     Cranial Nerves: No cranial nerve deficit.     Sensory: No sensory deficit.     Motor: No weakness.  Psychiatric:        Mood and Affect: Mood normal.        Behavior: Behavior normal.        Thought Content: Thought content normal.        Judgment: Judgment normal.     Procedures  Procedures  ED Course / MDM    Medical Decision Making Amount and/or Complexity of Data Reviewed Labs: ordered. Radiology:  ordered.  Risk Prescription drug management. Decision regarding hospitalization.   On assessment, patient is resting comfortably.  Her family is at bedside.  She states that she continues to feel generalized weakness but denies any other symptoms at this time.  Repeat BMP showed a sodium of 125.  Patient was admitted to hospitalist for further management.       Godfrey Pick, MD 07/24/22 [phone]

## 2022-07-24 NOTE — ED Triage Notes (Signed)
Pt BIBGEMS from home where she lives alone, pt was found unresponsive by grandson,  per grandson heart rate was in the 20s. Per grandson pt had a syncopal episode. When fire arrived on scene pt has two more syncopal episodes. When EMS arrived pt hr was in the mid 20s and then went up 60s. En route pt became bradycardic to the 20s with diaphoresis, pale, and cool. Atropine was given en route. Pt was dx w/ Covid two weeks ago, pt has since been weak, staying in bed, had a non productive cough, new shakiness in bilateral arms, poor po intake and minimal appetite. Per pt "I have been taking an abx and Ivermectin." Pt denies taking any of her daily medications today. Pt was supposed to go to family doctor today.  BP 140/70 HR 65

## 2022-07-25 ENCOUNTER — Other Ambulatory Visit: Payer: Self-pay

## 2022-07-25 DIAGNOSIS — Z882 Allergy status to sulfonamides status: Secondary | ICD-10-CM | POA: Diagnosis not present

## 2022-07-25 DIAGNOSIS — Z8249 Family history of ischemic heart disease and other diseases of the circulatory system: Secondary | ICD-10-CM | POA: Diagnosis not present

## 2022-07-25 DIAGNOSIS — R531 Weakness: Secondary | ICD-10-CM | POA: Diagnosis present

## 2022-07-25 DIAGNOSIS — J45909 Unspecified asthma, uncomplicated: Secondary | ICD-10-CM | POA: Diagnosis present

## 2022-07-25 DIAGNOSIS — E871 Hypo-osmolality and hyponatremia: Secondary | ICD-10-CM | POA: Diagnosis present

## 2022-07-25 DIAGNOSIS — Z79899 Other long term (current) drug therapy: Secondary | ICD-10-CM | POA: Diagnosis not present

## 2022-07-25 DIAGNOSIS — I129 Hypertensive chronic kidney disease with stage 1 through stage 4 chronic kidney disease, or unspecified chronic kidney disease: Secondary | ICD-10-CM | POA: Diagnosis present

## 2022-07-25 DIAGNOSIS — N1831 Chronic kidney disease, stage 3a: Secondary | ICD-10-CM | POA: Diagnosis present

## 2022-07-25 DIAGNOSIS — Z821 Family history of blindness and visual loss: Secondary | ICD-10-CM | POA: Diagnosis not present

## 2022-07-25 DIAGNOSIS — T502X5A Adverse effect of carbonic-anhydrase inhibitors, benzothiadiazides and other diuretics, initial encounter: Secondary | ICD-10-CM | POA: Diagnosis present

## 2022-07-25 DIAGNOSIS — R197 Diarrhea, unspecified: Secondary | ICD-10-CM | POA: Diagnosis present

## 2022-07-25 DIAGNOSIS — Z91040 Latex allergy status: Secondary | ICD-10-CM | POA: Diagnosis not present

## 2022-07-25 DIAGNOSIS — Z880 Allergy status to penicillin: Secondary | ICD-10-CM | POA: Diagnosis not present

## 2022-07-25 DIAGNOSIS — R053 Chronic cough: Secondary | ICD-10-CM | POA: Diagnosis present

## 2022-07-25 DIAGNOSIS — E860 Dehydration: Secondary | ICD-10-CM | POA: Diagnosis present

## 2022-07-25 DIAGNOSIS — Z7982 Long term (current) use of aspirin: Secondary | ICD-10-CM | POA: Diagnosis not present

## 2022-07-25 DIAGNOSIS — Z881 Allergy status to other antibiotic agents status: Secondary | ICD-10-CM | POA: Diagnosis not present

## 2022-07-25 DIAGNOSIS — R630 Anorexia: Secondary | ICD-10-CM | POA: Diagnosis present

## 2022-07-25 DIAGNOSIS — F419 Anxiety disorder, unspecified: Secondary | ICD-10-CM | POA: Diagnosis present

## 2022-07-25 DIAGNOSIS — R001 Bradycardia, unspecified: Secondary | ICD-10-CM | POA: Diagnosis present

## 2022-07-25 DIAGNOSIS — U071 COVID-19: Secondary | ICD-10-CM

## 2022-07-25 DIAGNOSIS — R112 Nausea with vomiting, unspecified: Secondary | ICD-10-CM | POA: Diagnosis present

## 2022-07-25 DIAGNOSIS — I10 Essential (primary) hypertension: Secondary | ICD-10-CM

## 2022-07-25 DIAGNOSIS — R55 Syncope and collapse: Secondary | ICD-10-CM | POA: Diagnosis present

## 2022-07-25 LAB — CBC
HCT: 31.8 % — ABNORMAL LOW (ref 36.0–46.0)
Hemoglobin: 11.6 g/dL — ABNORMAL LOW (ref 12.0–15.0)
MCH: 34 pg (ref 26.0–34.0)
MCHC: 36.5 g/dL — ABNORMAL HIGH (ref 30.0–36.0)
MCV: 93.3 fL (ref 80.0–100.0)
Platelets: 304 10*3/uL (ref 150–400)
RBC: 3.41 MIL/uL — ABNORMAL LOW (ref 3.87–5.11)
RDW: 12.1 % (ref 11.5–15.5)
WBC: 5 10*3/uL (ref 4.0–10.5)
nRBC: 0 % (ref 0.0–0.2)

## 2022-07-25 LAB — BASIC METABOLIC PANEL
Anion gap: 7 (ref 5–15)
Anion gap: 8 (ref 5–15)
Anion gap: 9 (ref 5–15)
BUN: 14 mg/dL (ref 8–23)
BUN: 12 mg/dL (ref 8–23)
BUN: 12 mg/dL (ref 8–23)
CO2: 20 mmol/L — ABNORMAL LOW (ref 22–32)
CO2: 22 mmol/L (ref 22–32)
CO2: 21 mmol/L — ABNORMAL LOW (ref 22–32)
Calcium: 9 mg/dL (ref 8.9–10.3)
Calcium: 9 mg/dL (ref 8.9–10.3)
Calcium: 9 mg/dL (ref 8.9–10.3)
Chloride: 96 mmol/L — ABNORMAL LOW (ref 98–111)
Chloride: 97 mmol/L — ABNORMAL LOW (ref 98–111)
Chloride: 97 mmol/L — ABNORMAL LOW (ref 98–111)
Creatinine, Ser: 0.93 mg/dL (ref 0.44–1.00)
Creatinine, Ser: 1.03 mg/dL — ABNORMAL HIGH (ref 0.44–1.00)
Creatinine, Ser: 1 mg/dL (ref 0.44–1.00)
GFR, Estimated: >60 mL/min (ref >60)
GFR, Estimated: 55 mL/min — ABNORMAL LOW (ref >60)
GFR, Estimated: 57 mL/min — ABNORMAL LOW (ref >60)
Glucose, Bld: 85 mg/dL (ref 70–99)
Glucose, Bld: 104 mg/dL — ABNORMAL HIGH (ref 70–99)
Glucose, Bld: 104 mg/dL — ABNORMAL HIGH (ref 70–99)
Potassium: 3.8 mmol/L (ref 3.5–5.1)
Potassium: 4.7 mmol/L (ref 3.5–5.1)
Potassium: 3.9 mmol/L (ref 3.5–5.1)
Sodium: 123 mmol/L — ABNORMAL LOW (ref 135–145)
Sodium: 127 mmol/L — ABNORMAL LOW (ref 135–145)
Sodium: 127 mmol/L — ABNORMAL LOW (ref 135–145)

## 2022-07-25 LAB — URINE CULTURE

## 2022-07-25 LAB — OSMOLALITY, URINE: Osmolality, Ur: 171 mOsm/kg — ABNORMAL LOW (ref 300–900)

## 2022-07-25 LAB — SODIUM, URINE, RANDOM: Sodium, Ur: 31 mmol/L

## 2022-07-25 MED ORDER — HYDROCOD POLI-CHLORPHE POLI ER 10-8 MG/5ML PO SUER: 5.0000 mL | Freq: Two times a day (BID) | ORAL | Status: DC | PRN

## 2022-07-25 MED ORDER — DEXTROMETHORPHAN POLISTIREX ER 30 MG/5ML PO SUER
30.0000 mg | Freq: Two times a day (BID) | ORAL | Status: DC
  Administered 2022-07-25 – 2022-07-27 (×5): 30 mg via ORAL
  Filled 2022-07-25 (×8): qty 5

## 2022-07-25 MED ORDER — SODIUM CHLORIDE 0.9 % IV SOLN: Freq: Once | INTRAVENOUS | Status: DC

## 2022-07-25 MED ORDER — SODIUM CHLORIDE 0.9 % IV SOLN: INTRAVENOUS | Status: AC

## 2022-07-25 MED ORDER — SODIUM CHLORIDE 0.9 % IV SOLN: Freq: Once | INTRAVENOUS | Status: AC

## 2022-07-25 MED ORDER — BENZONATATE 100 MG PO CAPS
200.0000 mg | ORAL_CAPSULE | Freq: Three times a day (TID) | ORAL | Status: DC
  Administered 2022-07-25 – 2022-07-27 (×6): 200 mg via ORAL
  Filled 2022-07-25 (×6): qty 2

## 2022-07-25 NOTE — Progress Notes (Signed)
Progress Note  Patient: Yvonne Rich SWN:[id number] DOB: Dec 02, 1940  DOA: 07/24/2022  DOS: 07/25/2022    Brief hospital course: Yvonne Rich is an 81 y.o. female with a history of stage III CKD, HTN, glaucoma, recent covid-19 diagnosed by home test 10/8 treated with ivermectin and azithromycin who presented to the ED 10/16 after 2 episodes where she was unresponsive in the setting of more than a week of diffuse weakness, and more recent nausea, vomiting, and diarrhea. She was found to be dehydrated, hyponatremic with nonfocal neurological exam. IV fluids were given, and HCTZ and ivermectin stopped. Sodium level improved but has worsened overnight, she remains markedly weak/fatigued with cough but no dyspnea or pneumonia on CXR.   Assessment and Plan: Near syncope: Is not technically syncope as she did not lose postural tone. Continues to have nonfocal exam.  - Check ECG. Note sinus bradycardia on telemetry overnight without pauses.  - Check orthostatics - update, pt unable to stand long enough for this but no definite drop noted.  - Sinus bradycardia thus far without dropped beats, 1st deg AVB. Will continue cardiac monitoring for now.  - In setting of hyponatremia ?if seizure activity. No recurrence of symptoms or convulsions or history of seizure. EEG likely low yield.   Hyponatremia:  - Serum and urine studies ordered, just not resulted yet. Will assume hypotonic etiology with dehydration, improved with isotonic saline, compounded by thiazide diuretic. Continue isotonic saline and serial BMP.   Covid-19 infection: No evidence of pneumonia on CXR, though has classic persistent hacking cough. Symptoms several days before, but initially tested positive on 10/8. Reportedly vaccinated.  - Has been treated by Dr. Lenise Arena in [place], [place] with ivermectin (dose unknown) and azithromycin. Had vomiting and diarrhea shortly after initiating this. Certainly is outside window of  benefit for active therapies. Though tested positive again at admission on 10/16, 10-day isolation period will be completed as of 10/18.  - Incentive spirometry, pharmacologic VTE ppx - Maximize antitussive Tx with tessalon, guaifenesin and prn dextromethorphan. If severe symptoms prohibiting sleep, can take tussionex.   Weakness: Diffuse, nonfocal on exam. Related to covid-19, at risk for such due to age.  - PT/OT consulted. At her current functional state, SNF may be required. Will monitor mobility with electrolyte correction and rehydration.  HTN: BPs becoming elevated after fluid resuscitation.  - Restarted losartan - Continue metoprolol  - Hold HCTZ as above  Subjective: Still with troublesome cough, no shortness of breath or chest pain. Finds bed uncomfortable. Ate a bit last night, no a vigorous appetite currently.  Objective: Vitals:   07/25/22 1300 07/25/22 1400 07/25/22 1530 07/25/22 1530  BP: (!) 119/50 (!) 145/67 (!) 137/48   Pulse: (!) 55 61 64   Resp: '17 15 14   '$ Temp:    97.7 F (36.5 C)  TempSrc:    Oral  SpO2: 98% 100% 99%    Gen: Elderly female in no distress Pulm: Nonlabored breathing room air. Clear CV: Regular rate and rhythm. No murmur, rub, or gallop. No JVD, no dependent edema. GI: Abdomen soft, non-tender, non-distended, with normoactive bowel sounds.  Ext: Warm, no deformities Skin: No rashes, lesions or ulcers on visualized skin. Neuro: Alert and oriented. No focal neurological deficits. Psych: Judgement and insight appear fair. Mood euthymic & affect congruent. Behavior is appropriate.    Data Personally reviewed: CBC: Recent Labs  Lab 07/24/22 1146 07/24/22 1247 07/25/22 0408  WBC  --  4.0 5.0  NEUTROABS  --  2.8  --   HGB 13.9 12.1 11.6*  HCT 41.0 32.5* 31.8*  MCV  --  92.9 93.3  PLT  --  285 945   Basic Metabolic Panel: Recent Labs  Lab 07/24/22 1138 07/24/22 1146 07/24/22 1248 07/24/22 1442 07/25/22 0408 07/25/22 1300  NA 119*  120*  --  125* 123* 127*  K 3.4* 3.4*  --  2.9* 3.8 4.7  CL 86* 89*  --  101 96* 97*  CO2 19*  --   --  18* 20* 22  GLUCOSE 122* 112*  --  81 85 104*  BUN 16 23  --  '15 14 12  '$ CREATININE 1.12* 1.10*  --  0.88 0.93 1.03*  CALCIUM 9.4  --   --  7.1* 9.0 9.0  MG  --   --  1.4*  --   --   --   PHOS  --   --  2.6  --   --   --    GFR: CrCl cannot be calculated (Unknown ideal weight.). Liver Function Tests: Recent Labs  Lab 07/24/22 1138  AST 18  ALT 12  ALKPHOS 59  BILITOT 1.2  PROT 6.6  ALBUMIN 4.0   No results for input(s): "LIPASE", "AMYLASE" in the last 168 hours. No results for input(s): "AMMONIA" in the last 168 hours. Coagulation Profile: Recent Labs  Lab 07/24/22 1138  INR 1.0   Cardiac Enzymes: No results for input(s): "CKTOTAL", "CKMB", "CKMBINDEX", "TROPONINI" in the last 168 hours. BNP (last 3 results) No results for input(s): "PROBNP" in the last 8760 hours. HbA1C: No results for input(s): "HGBA1C" in the last 72 hours. CBG: No results for input(s): "GLUCAP" in the last 168 hours. Lipid Profile: No results for input(s): "CHOL", "HDL", "LDLCALC", "TRIG", "CHOLHDL", "LDLDIRECT" in the last 72 hours. Thyroid Function Tests: No results for input(s): "TSH", "T4TOTAL", "FREET4", "T3FREE", "THYROIDAB" in the last 72 hours. Anemia Panel: No results for input(s): "VITAMINB12", "FOLATE", "FERRITIN", "TIBC", "IRON", "RETICCTPCT" in the last 72 hours. Urine analysis:    Component Value Date/Time   COLORURINE STRAW (A) 07/24/2022 1442   APPEARANCEUR CLEAR 07/24/2022 1442   LABSPEC 1.005 07/24/2022 1442   PHURINE 6.0 07/24/2022 1442   GLUCOSEU NEGATIVE 07/24/2022 1442   HGBUR NEGATIVE 07/24/2022 [place] 07/24/2022 1442   KETONESUR NEGATIVE 07/24/2022 1442   PROTEINUR NEGATIVE 07/24/2022 1442   NITRITE NEGATIVE 07/24/2022 1442   LEUKOCYTESUR NEGATIVE 07/24/2022 1442   Recent Results (from the past 240 hour(s))  Culture, blood (routine x 2)      Status: None (Preliminary result)   Collection Time: 07/24/22 11:15 AM   Specimen: BLOOD  Result Value Ref Range Status   Specimen Description BLOOD SITE NOT SPECIFIED  Final   Special Requests   Final    BOTTLES DRAWN AEROBIC AND ANAEROBIC Blood Culture adequate volume   Culture   Final    NO GROWTH < 24 HOURS Performed at [facility], [place] [address] [place], [place] [address]    Report Status PENDING  Incomplete  Urine Culture     Status: Abnormal   Collection Time: 07/24/22 11:15 AM   Specimen: Urine, Clean Catch  Result Value Ref Range Status   Specimen Description URINE, CLEAN CATCH  Final   Special Requests   Final    NONE Performed at [facility], [place] [address] [place], [place] [address]    Culture MULTIPLE SPECIES PRESENT, SUGGEST RECOLLECTION (A)  Final  Report Status 07/25/2022 FINAL  Final  Resp Panel by RT-PCR (Flu A&B, Covid) Anterior Nasal Swab     Status: Abnormal   Collection Time: 07/24/22 11:16 AM   Specimen: Anterior Nasal Swab  Result Value Ref Range Status   SARS Coronavirus 2 by RT PCR POSITIVE (A) NEGATIVE Final    Comment: (NOTE) SARS-CoV-2 target nucleic acids are DETECTED.  The SARS-CoV-2 RNA is generally detectable in upper respiratory specimens during the acute phase of infection. Positive results are indicative of the presence of the identified virus, but do not rule out bacterial infection or co-infection with other pathogens not detected by the test. Clinical correlation with patient history and other diagnostic information is necessary to determine patient infection status. The expected result is Negative.  Fact Sheet for Patients: EntrepreneurPulse.com.au  Fact Sheet for Healthcare Providers: IncredibleEmployment.be  This test is not yet approved or cleared by the [place] FDA and  has been authorized for detection and/or diagnosis of SARS-CoV-2 by FDA under an Emergency Use  Authorization (EUA).  This EUA will remain in effect (meaning this test can be used) for the duration of  the COVID-19 declaration under Section 564(b)(1) of the A ct, 21 U.S.C. section 360bbb-3(b)(1), unless the authorization is terminated or revoked sooner.     Influenza A by PCR NEGATIVE NEGATIVE Final   Influenza B by PCR NEGATIVE NEGATIVE Final    Comment: (NOTE) The Xpert Xpress SARS-CoV-2/FLU/RSV plus assay is intended as an aid in the diagnosis of influenza from Nasopharyngeal swab specimens and should not be used as a sole basis for treatment. Nasal washings and aspirates are unacceptable for Xpert Xpress SARS-CoV-2/FLU/RSV testing.  Fact Sheet for Patients: EntrepreneurPulse.com.au  Fact Sheet for Healthcare Providers: IncredibleEmployment.be  This test is not yet approved or cleared by the [place] FDA and has been authorized for detection and/or diagnosis of SARS-CoV-2 by FDA under an Emergency Use Authorization (EUA). This EUA will remain in effect (meaning this test can be used) for the duration of the COVID-19 declaration under Section 564(b)(1) of the Act, 21 U.S.C. section 360bbb-3(b)(1), unless the authorization is terminated or revoked.  Performed at [facility], [place] [address], [place], [place] [address]   Culture, blood (routine x 2)     Status: None (Preliminary result)   Collection Time: 07/24/22 11:20 AM   Specimen: BLOOD  Result Value Ref Range Status   Specimen Description BLOOD SITE NOT SPECIFIED  Final   Special Requests   Final    BOTTLES DRAWN AEROBIC AND ANAEROBIC Blood Culture adequate volume   Culture   Final    NO GROWTH < 24 HOURS Performed at [facility], [place] [address], [place], [place] [address]    Report Status PENDING  Incomplete     DG Chest Port 1 View  Result Date: 07/24/2022 CLINICAL DATA:  Weakness.  COVID diagnosis EXAM: PORTABLE CHEST 1 VIEW COMPARISON:  None  Available. FINDINGS: Normal mediastinum and cardiac silhouette. Normal pulmonary vasculature. No evidence of effusion, infiltrate, or pneumothorax. No acute bony abnormality. Atherosclerotic calcification of the aorta. IMPRESSION: 1.  No acute cardiopulmonary process. 2.  Aortic Atherosclerosis (ICD10-I70.0). Electronically Signed   By: Suzy Bouchard M.D.   On: 07/24/2022 11:51      Family Communication: Daughter at bedside  Disposition: Status is: Inpatient Remains inpatient appropriate because: Persistent severe hyponatremia, weakness Planned Discharge Destination:  TBD    Patrecia Pour, MD 07/25/2022 4:38 PM Page by [email or web address]

## 2022-07-25 NOTE — Evaluation (Addendum)
Occupational Therapy Evaluation Patient Details Name: Yvonne Rich MRN: [id number] DOB: Apr 30, 1941 Today's Date: 07/25/2022   History of Present Illness 81 yo female with onset of Covid and extended bedrest has arrived at hosp on 10/16, had near sycnopal episode and cough, HA and GI upset likely  from meds.  PMHx:  HTN, Covid 74, low Na+, CKD 3a, anxiety   Clinical Impression   PTA, pt lives alone, typically Independent in all ADLs, IADLs and mobility without AD. Pt presents now with generalized weakness and endurance deficits. Overall, pt requires Min A for bed mobility, min guard for standing with RW and taking steps along stretcher side. Pt requires Setup for UB ADL and overall Min A for LB ADLs, able to stand for duration of assist with dressing tasks though did endorse fatigue. Pt's daughters present, hands on to assist and share that pt's motivation often a limiting factor with completion of OOB tasks. As purewick malfunctioning, provided [facility] for pt to use while in ED. Anticipate pt to continually improve with mobility while admitted to return home with IADL assist from family. Encouraged pt to use RW for mobility at home until strength regained.     Recommendations for follow up therapy are one component of a multi-disciplinary discharge planning process, led by the attending physician.  Recommendations may be updated based on patient status, additional functional criteria and insurance authorization.   Follow Up Recommendations  No OT follow up    Assistance Recommended at Discharge Intermittent Supervision/Assistance  Patient can return home with the following A little help with bathing/dressing/bathroom;Assistance with cooking/housework    Functional Status Assessment  Patient has had a recent decline in their functional status and demonstrates the ability to make significant improvements in function in a reasonable and predictable amount of time.  Equipment Recommendations   None recommended by OT    Recommendations for Other Services       Precautions / Restrictions Precautions Precautions: Fall Precaution Comments: monitor orthostatics Restrictions Weight Bearing Restrictions: No      Mobility Bed Mobility Overal bed mobility: Needs Assistance Bed Mobility: Supine to Sit, Sit to Supine     Supine to sit: Min assist Sit to supine: Min guard   General bed mobility comments: minor help to lift trunk then to return legs to bed    Transfers Overall transfer level: Needs assistance Equipment used: Rolling walker (2 wheels) Transfers: Sit to/from Stand Sit to Stand: Min guard                  Balance Overall balance assessment: Needs assistance Sitting-balance support: Feet supported Sitting balance-Leahy Scale: Fair     Standing balance support: Bilateral upper extremity supported, During functional activity Standing balance-Leahy Scale: Fair Standing balance comment: pt felt better standing with RW/additional support                           ADL either performed or assessed with clinical judgement   ADL Overall ADL's : Needs assistance/impaired Eating/Feeding: Independent   Grooming: Supervision/safety;Standing   Upper Body Bathing: Set up;Sitting   Lower Body Bathing: Minimal assistance;Sit to/from stand   Upper Body Dressing : Set up;Sitting   Lower Body Dressing: Minimal assistance;Sitting/lateral leans;Sit to/from stand Lower Body Dressing Details (indicate cue type and reason): increased assist d/t wrap around briefs difficult to manage. pt able to stand for duration of task though endorsed fatigue Toilet Transfer: Min guard;Stand-pivot;BSC/3in1;Rolling walker (2 wheels)  Toileting- Clothing Manipulation and Hygiene: Minimal assistance;Sitting/lateral lean;Sit to/from stand Toileting - Clothing Manipulation Details (indicate cue type and reason): increased assist needed d/t purewick malfunction in brief.  Provided BSC for pt to use in ED       General ADL Comments: Limited by fatigue and weakness though able to move fairly well. Family report pt with decreased motivation at home to complete tasks - feel like this is related to pt's husband passing in May     Vision Baseline Vision/History: 1 Wears glasses Ability to See in Adequate Light: 0 Adequate Patient Visual Report: No change from baseline Vision Assessment?: No apparent visual deficits     Perception     Praxis      Pertinent Vitals/Pain Pain Assessment Pain Assessment: No/denies pain     Hand Dominance Right   Extremity/Trunk Assessment Upper Extremity Assessment Upper Extremity Assessment: Generalized weakness   Lower Extremity Assessment Lower Extremity Assessment: Defer to PT evaluation   Cervical / Trunk Assessment Cervical / Trunk Assessment: Kyphotic   Communication Communication Communication: HOH   Cognition Arousal/Alertness: Awake/alert Behavior During Therapy: WFL for tasks assessed/performed, Flat affect Overall Cognitive Status: Within Functional Limits for tasks assessed                                 General Comments: flat affect, appears [facility] cognitively. feel flat affect d/t spouse passing away in May (?)     General Comments  Daughters present and supportive    Exercises     Shoulder Instructions      Home Living Family/patient expects to be discharged to:: Private residence Living Arrangements: Alone Available Help at Discharge: Family;Available PRN/intermittently Type of Home: House Home Access: Ramped entrance     Home Layout: Multi-level;Able to live on main level with bedroom/bathroom     Bathroom Shower/Tub: Walk-in shower   Bathroom Toilet: Handicapped height     Home Equipment: Advice worker (2 wheels);Wheelchair - manual   Additional Comments: does not access attic or basement; has multiple equipment from late husband      Prior  Functioning/Environment Prior Level of Function : Independent/Modified Independent;Driving                        OT Problem List: Decreased strength;Decreased activity tolerance;Impaired balance (sitting and/or standing)      OT Treatment/Interventions: Self-care/ADL training;Therapeutic exercise;Energy conservation;DME and/or AE instruction;Therapeutic activities;Patient/family education    OT Goals(Current goals can be found in the care plan section) Acute Rehab OT Goals Patient Stated Goal: family would like for pt to get OOB more, regain strength OT Goal Formulation: With patient/family Time For Goal Achievement: 08/08/22 Potential to Achieve Goals: Good  OT Frequency: Min 2X/week    Co-evaluation              AM-PAC OT "6 Clicks" Daily Activity     Outcome Measure Help from another person eating meals?: None Help from another person taking care of personal grooming?: A Little Help from another person toileting, which includes using toliet, bedpan, or urinal?: A Little Help from another person bathing (including washing, rinsing, drying)?: A Little Help from another person to put on and taking off regular upper body clothing?: A Little Help from another person to put on and taking off regular lower body clothing?: A Little 6 Click Score: 19   End of Session Equipment Utilized During Treatment: Rolling walker (  2 wheels) Nurse Communication: Mobility status  Activity Tolerance: Patient tolerated treatment well;Patient limited by fatigue Patient left: in bed;with call bell/phone within reach;with family/visitor present  OT Visit Diagnosis: Muscle weakness (generalized) (M62.81)                Time: 7989-2119 OT Time Calculation (min): 21 min Charges:  OT General Charges $OT Visit: 1 Visit OT Evaluation $OT Eval Low Complexity: 1 Low  Malachy Chamber, OTR/L Acute Rehab Services Office: [phone]   Layla Maw 07/25/2022, 1:12 PM

## 2022-07-25 NOTE — Progress Notes (Signed)
OT Cancellation Note  Patient Details Name: Yvonne Rich MRN: [id number] DOB: [birth year]   Cancelled Treatment:    Reason Eval/Treat Not Completed: Other (comment) Pt currently eating breakfast, will follow up.  Layla Maw 07/25/2022, 9:03 AM

## 2022-07-25 NOTE — Progress Notes (Signed)
Physical Therapy Evaluation Patient Details Name: Yvonne Rich MRN: [id number] DOB: 09/16/1941 Today's Date: 07/25/2022  History of Present Illness  81 yo female with onset of Covid and extended bedrest has arrived at hosp on 10/16, had near sycnopal episode and cough, HA and GI upset likely  from meds.  PMHx:  HTN, Covid 27, low Na+, CKD 3a, anxiety  Clinical Impression  Pt was seen for mobility on side of bed with daughters in attendance, and note her vitals as follows: Supine BP 114/36, p 61, sat 99%, MAP 57; sitting  BP 122/54, p 63, sat 98% and MAP 75;  Standing BP 109/80, p 64, sat 98%, MAP 87.   Pt is too fatigued to walk and agrees with family discussion to consider SNF placement for progressive strengthening, as well as PT to see her inpt for gait and balance skills as tolerated.  Family supportive and should be able to assist with transition to rehab, then home.      Recommendations for follow up therapy are one component of a multi-disciplinary discharge planning process, led by the attending physician.  Recommendations may be updated based on patient status, additional functional criteria and insurance authorization.  Follow Up Recommendations Skilled nursing-short term rehab (<3 hours/day) Can patient physically be transported by private vehicle: No    Assistance Recommended at Discharge Intermittent Supervision/Assistance  Patient can return home with the following  A little help with walking and/or transfers;A little help with bathing/dressing/bathroom;Assistance with cooking/housework;Assist for transportation;Help with stairs or ramp for entrance    Equipment Recommendations None recommended by PT  Recommendations for Other Services       Functional Status Assessment Patient has had a recent decline in their functional status and demonstrates the ability to make significant improvements in function in a reasonable and predictable amount of time.     Precautions /  Restrictions Precautions Precautions: Fall Precaution Comments: monitor vitals      Mobility  Bed Mobility Overal bed mobility: Needs Assistance Bed Mobility: Supine to Sit, Sit to Supine     Supine to sit: Min assist Sit to supine: Min guard, Min assist   General bed mobility comments: minor help to lift trunk then to return legs to bed    Transfers Overall transfer level: Needs assistance Equipment used: Rolling walker (2 wheels), 1 person hand held assist Transfers: Sit to/from Stand Sit to Stand: Min guard           General transfer comment: min guard but maintained contact with pt in standing due to fatigue and general weakness    Ambulation/Gait               General Gait Details: declines to try, very worn out  Stairs            Wheelchair Mobility    Modified Rankin (Stroke Patients Only)       Balance Overall balance assessment: Needs assistance Sitting-balance support: Feet supported Sitting balance-Leahy Scale: Fair     Standing balance support: Bilateral upper extremity supported, During functional activity Standing balance-Leahy Scale: Poor Standing balance comment: requires help to stand but is able to use walker effectively to get BP in standing.                             Pertinent Vitals/Pain Pain Assessment Pain Assessment: Faces Faces Pain Scale: No hurt    Home Living Family/patient expects to be discharged to:: Private  residence Living Arrangements: Alone Available Help at Discharge: Family;Available PRN/intermittently Type of Home: House Home Access: Ramped entrance       Home Layout: Multi-level;Able to live on main level with bedroom/bathroom Home Equipment: Advice worker (2 wheels)      Prior Function Prior Level of Function : Independent/Modified Independent;Driving                     Hand Dominance   Dominant Hand: Right    Extremity/Trunk Assessment   Upper  Extremity Assessment Upper Extremity Assessment: Defer to OT evaluation    Lower Extremity Assessment Lower Extremity Assessment: Generalized weakness    Cervical / Trunk Assessment Cervical / Trunk Assessment: Kyphotic (mild)  Communication   Communication: HOH  Cognition                                                General Comments General comments (skin integrity, edema, etc.): Pt is noted to have BP and sats/Hr in functional range with no orthostatic numbers.    Exercises     Assessment/Plan    PT Assessment Patient needs continued PT services  PT Problem List Cardiopulmonary status limiting activity;Decreased balance;Decreased strength;Decreased mobility;Decreased activity tolerance       PT Treatment Interventions DME instruction;Gait training;Functional mobility training;Therapeutic activities;Therapeutic exercise;Balance training;Neuromuscular re-education;Patient/family education    PT Goals (Current goals can be found in the Care Plan section)  Acute Rehab PT Goals Patient Stated Goal: to get better PT Goal Formulation: With patient/family Time For Goal Achievement: 08/08/22 Potential to Achieve Goals: Good    Frequency Min 3X/week     Co-evaluation               AM-PAC PT "6 Clicks" Mobility  Outcome Measure Help needed turning from your back to your side while in a flat bed without using bedrails?: None Help needed moving from lying on your back to sitting on the side of a flat bed without using bedrails?: A Little Help needed moving to and from a bed to a chair (including a wheelchair)?: A Little Help needed standing up from a chair using your arms (e.g., wheelchair or bedside chair)?: A Little Help needed to walk in hospital room?: Total Help needed climbing 3-5 steps with a railing? : Total 6 Click Score: 15    End of Session Equipment Utilized During Treatment: Gait belt;Oxygen (2L) Activity Tolerance: Patient limited  by fatigue;Treatment limited secondary to medical complications (Comment) Patient left: in bed;with call bell/phone within reach;with family/visitor present Nurse Communication: Mobility status;Other (comment) (provided numbers for BP) PT Visit Diagnosis: Unsteadiness on feet (R26.81);Muscle weakness (generalized) (M62.81);Difficulty in walking, not elsewhere classified (R26.2)    Time: 1040-1108 PT Time Calculation (min) (ACUTE ONLY): 28 min   Charges:   PT Evaluation $PT Eval Moderate Complexity: 1 Mod PT Treatments $Therapeutic Activity: 8-22 mins       Ramond Dial 07/25/2022, 12:57 PM  Mee Hives, PT PhD Acute Rehab Dept. Number: [place] and [place]

## 2022-07-25 NOTE — ED Notes (Signed)
Pt unable to stand long enough to obtain 3 minute standing orthostatics due to weakness.

## 2022-07-26 DIAGNOSIS — U071 COVID-19: Secondary | ICD-10-CM | POA: Diagnosis not present

## 2022-07-26 DIAGNOSIS — N1831 Chronic kidney disease, stage 3a: Secondary | ICD-10-CM | POA: Diagnosis not present

## 2022-07-26 DIAGNOSIS — R55 Syncope and collapse: Secondary | ICD-10-CM | POA: Diagnosis not present

## 2022-07-26 DIAGNOSIS — E871 Hypo-osmolality and hyponatremia: Secondary | ICD-10-CM | POA: Diagnosis not present

## 2022-07-26 LAB — BASIC METABOLIC PANEL
Anion gap: 9 (ref 5–15)
Anion gap: 7 (ref 5–15)
BUN: 12 mg/dL (ref 8–23)
BUN: 9 mg/dL (ref 8–23)
CO2: 19 mmol/L — ABNORMAL LOW (ref 22–32)
CO2: 21 mmol/L — ABNORMAL LOW (ref 22–32)
Calcium: 8.9 mg/dL (ref 8.9–10.3)
Calcium: 9.3 mg/dL (ref 8.9–10.3)
Chloride: 98 mmol/L (ref 98–111)
Chloride: 100 mmol/L (ref 98–111)
Creatinine, Ser: 0.83 mg/dL (ref 0.44–1.00)
Creatinine, Ser: 0.83 mg/dL (ref 0.44–1.00)
GFR, Estimated: >60 mL/min (ref >60)
GFR, Estimated: >60 mL/min (ref >60)
Glucose, Bld: 90 mg/dL (ref 70–99)
Glucose, Bld: 86 mg/dL (ref 70–99)
Potassium: 3.9 mmol/L (ref 3.5–5.1)
Potassium: 3.7 mmol/L (ref 3.5–5.1)
Sodium: 126 mmol/L — ABNORMAL LOW (ref 135–145)
Sodium: 128 mmol/L — ABNORMAL LOW (ref 135–145)

## 2022-07-26 LAB — OSMOLALITY: Osmolality: 268 mOsm/kg — ABNORMAL LOW (ref 275–295)

## 2022-07-26 MED ORDER — MELATONIN 5 MG PO TABS
10.0000 mg | ORAL_TABLET | Freq: Every evening | ORAL | Status: DC | PRN
  Administered 2022-07-26: 10 mg via ORAL
  Filled 2022-07-26: qty 2

## 2022-07-26 MED ORDER — SODIUM CHLORIDE 1 G PO TABS
1.0000 g | ORAL_TABLET | Freq: Three times a day (TID) | ORAL | Status: AC
  Administered 2022-07-26 – 2022-07-27 (×4): 1 g via ORAL
  Filled 2022-07-26 (×4): qty 1

## 2022-07-26 MED ORDER — METOPROLOL TARTRATE 12.5 MG HALF TABLET
12.5000 mg | ORAL_TABLET | Freq: Two times a day (BID) | ORAL | Status: DC
  Administered 2022-07-26 – 2022-07-27 (×2): 12.5 mg via ORAL
  Filled 2022-07-26 (×2): qty 1

## 2022-07-26 NOTE — Progress Notes (Signed)
Nutrition Brief Note  RD received a consult for nutritional goals.   81 y.o. female presented to the ED after a near syncope event, recent positive COVID test (treated by PCP). PMH includes HTN, CKD IIIa, and glaucoma. Pt admitted with near syncope, hyponatremia, and COVID.   Met with pt and family at bedside. Pt and family report that her appetite was poor just for a week prior to coming here but improved just days before arriving and has continued to improve while here. Family brought pt in lunch today and ate well. Pt reports that she would drink Ensure at home sometimes as well. Family more concerned about fluid intake and asked for a pitcher to help encourage fluids, asked RN to bring in pitcher. Explained to family where ice and water machine is to help as well. RD offered to order pt nutritional supplements but pt declined at this time.   Current diet order is Regular, patient is consuming approximately 100% of meals at this time. Labs and medications reviewed.   No nutrition interventions warranted at this time. If nutrition issues arise, please consult RD.    Hermina Barters RD, LDN Clinical Dietitian See Shea Evans for contact information.

## 2022-07-26 NOTE — Plan of Care (Signed)

## 2022-07-26 NOTE — Progress Notes (Signed)
Progress Note  Patient: Yvonne Rich DOB: December 09, 1940  DOA: 07/24/2022  DOS: 07/26/2022    Brief hospital course:  Yvonne Rich is an 81 y.o. female with a history of stage III CKD, HTN, glaucoma, recent covid-19 diagnosed by home test 10/8 treated with ivermectin and azithromycin who presented to the ED 10/16 after 2 episodes where she was unresponsive in the setting of more than a week of diffuse weakness, and more recent nausea, vomiting, and diarrhea. She was found to be dehydrated, hyponatremic with nonfocal neurological exam. IV fluids were given, and HCTZ and ivermectin stopped. Sodium level improved but has worsened overnight, she remains markedly weak/fatigued with cough but no dyspnea or pneumonia on CXR.   Assessment and Plan:  Near syncope:  - Is not technically syncope as she did not lose postural tone. Continues to have nonfocal exam.  - Check ECG. Note sinus bradycardia on telemetry overnight without pauses.  - Check orthostatics - update, pt unable to stand long enough for this but no definite drop noted.  - Sinus bradycardia thus far without dropped beats, 1st deg AVB. Will continue cardiac monitoring for now.  We will decrease her metoprolol as well.Marland Kitchen   Hyponatremia:  -Is most likely due to poor oral intake, setting of hydrochlorothiazide use  -Improving, sodium is 128 today.  Added on salt tablets.  Covid-19 infection:  - No evidence of pneumonia on CXR, though has classic persistent hacking cough. Symptoms several days before, but initially tested positive on 10/8. Reportedly vaccinated.  - Has been treated by Dr. Lenise Arena in [place], [place] with ivermectin (dose unknown) and azithromycin. Had vomiting and diarrhea shortly after initiating this. Certainly is outside window of benefit for active therapies. Though tested positive again at admission on 10/16, 10-day isolation period will be completed as of 10/18.  - Incentive spirometry,  pharmacologic VTE ppx - Maximize antitussive Tx with tessalon, guaifenesin and prn dextromethorphan. If severe symptoms prohibiting sleep, can take tussionex.   Weakness: Diffuse, nonfocal on exam. Related to covid-19, at risk for such due to age.  - PT/OT consulted.  Commendation for SNF placement, but as discussed with the daughter did rather go home with home health.  HTN: BPs becoming elevated after fluid resuscitation.  - Restarted losartan - Continue metoprolol, have lowered the dose by 50% due to bradycardia - Hold HCTZ as above  Subjective:   Appetite has improved, but fluid intake remains poor as discussed with daughter at bedside, patient reports she is feeling better today, her main complaint is cough, nonproductive.    Objective: Vitals:   07/26/22 0322 07/26/22 0800 07/26/22 1200 07/26/22 1249  BP: (!) 147/75 (!) 146/56  (!) 143/58  Pulse: (!) 59 63  (!) 48  Resp: '17 14 17 18  '$ Temp: 98 F (36.7 C) (!) 97.5 F (36.4 C)  97.8 F (36.6 C)  TempSrc: Oral Oral  Oral  SpO2: 99% 99%  99%  Weight:      Height:       Awake Alert, Oriented X 3, No new F.N deficits, frail. Symmetrical Chest wall movement, Good air movement bilaterally, CTAB RRR,No Gallops,Rubs or new Murmurs, No Parasternal Heave +ve B.Sounds, Abd Soft, No tenderness, No rebound - guarding or rigidity. No Cyanosis, Clubbing or edema, No new Rash or bruise     Data Personally reviewed: CBC: Recent Labs  Lab 07/24/22 1146 07/24/22 1247 07/25/22 0408  WBC  --  4.0 5.0  NEUTROABS  --  2.8  --  HGB 13.9 12.1 11.6*  HCT 41.0 32.5* 31.8*  MCV  --  92.9 93.3  PLT  --  285 878   Basic Metabolic Panel: Recent Labs  Lab 07/24/22 1248 07/24/22 1442 07/25/22 0408 07/25/22 1300 07/25/22 1849 07/26/22 0019 07/26/22 0701  NA  --    < > 123* 127* 127* 126* 128*  K  --    < > 3.8 4.7 3.9 3.9 3.7  CL  --    < > 96* 97* 97* 98 100  CO2  --    < > 20* 22 21* 19* 21*  GLUCOSE  --    < > 85 104* 104* 90  86  BUN  --    < > '14 12 12 12 9  '$ CREATININE  --    < > 0.93 1.03* 1.00 0.83 0.83  CALCIUM  --    < > 9.0 9.0 9.0 8.9 9.3  MG 1.4*  --   --   --   --   --   --   PHOS 2.6  --   --   --   --   --   --    < > = values in this interval not displayed.   GFR: Estimated Creatinine Clearance: 62.9 mL/min (by C-G formula based on SCr of 0.83 mg/dL). Liver Function Tests: Recent Labs  Lab 07/24/22 1138  AST 18  ALT 12  ALKPHOS 59  BILITOT 1.2  PROT 6.6  ALBUMIN 4.0   No results for input(s): "LIPASE", "AMYLASE" in the last 168 hours. No results for input(s): "AMMONIA" in the last 168 hours. Coagulation Profile: Recent Labs  Lab 07/24/22 1138  INR 1.0   Cardiac Enzymes: No results for input(s): "CKTOTAL", "CKMB", "CKMBINDEX", "TROPONINI" in the last 168 hours. BNP (last 3 results) No results for input(s): "PROBNP" in the last 8760 hours. HbA1C: No results for input(s): "HGBA1C" in the last 72 hours. CBG: No results for input(s): "GLUCAP" in the last 168 hours. Lipid Profile: No results for input(s): "CHOL", "HDL", "LDLCALC", "TRIG", "CHOLHDL", "LDLDIRECT" in the last 72 hours. Thyroid Function Tests: No results for input(s): "TSH", "T4TOTAL", "FREET4", "T3FREE", "THYROIDAB" in the last 72 hours. Anemia Panel: No results for input(s): "VITAMINB12", "FOLATE", "FERRITIN", "TIBC", "IRON", "RETICCTPCT" in the last 72 hours. Urine analysis:    Component Value Date/Time   COLORURINE STRAW (A) 07/24/2022 1442   APPEARANCEUR CLEAR 07/24/2022 1442   LABSPEC 1.005 07/24/2022 1442   PHURINE 6.0 07/24/2022 1442   GLUCOSEU NEGATIVE 07/24/2022 1442   HGBUR NEGATIVE 07/24/2022 [place] 07/24/2022 1442   KETONESUR NEGATIVE 07/24/2022 1442   PROTEINUR NEGATIVE 07/24/2022 1442   NITRITE NEGATIVE 07/24/2022 1442   LEUKOCYTESUR NEGATIVE 07/24/2022 1442   Recent Results (from the past 240 hour(s))  Culture, blood (routine x 2)     Status: None (Preliminary result)    Collection Time: 07/24/22 11:15 AM   Specimen: BLOOD  Result Value Ref Range Status   Specimen Description BLOOD SITE NOT SPECIFIED  Final   Special Requests   Final    BOTTLES DRAWN AEROBIC AND ANAEROBIC Blood Culture adequate volume   Culture   Final    NO GROWTH 2 DAYS Performed at [facility], [place] [address], [place], [place] [address]    Report Status PENDING  Incomplete  Urine Culture     Status: Abnormal   Collection Time: 07/24/22 11:15 AM   Specimen: Urine, Clean Catch  Result Value Ref Range  Status   Specimen Description URINE, CLEAN CATCH  Final   Special Requests   Final    NONE Performed at [facility], [place] [address], [place], [place] [address]    Culture MULTIPLE SPECIES PRESENT, SUGGEST RECOLLECTION (A)  Final   Report Status 07/25/2022 FINAL  Final  Resp Panel by RT-PCR (Flu A&B, Covid) Anterior Nasal Swab     Status: Abnormal   Collection Time: 07/24/22 11:16 AM   Specimen: Anterior Nasal Swab  Result Value Ref Range Status   SARS Coronavirus 2 by RT PCR POSITIVE (A) NEGATIVE Final    Comment: (NOTE) SARS-CoV-2 target nucleic acids are DETECTED.  The SARS-CoV-2 RNA is generally detectable in upper respiratory specimens during the acute phase of infection. Positive results are indicative of the presence of the identified virus, but do not rule out bacterial infection or co-infection with other pathogens not detected by the test. Clinical correlation with patient history and other diagnostic information is necessary to determine patient infection status. The expected result is Negative.  Fact Sheet for Patients: EntrepreneurPulse.com.au  Fact Sheet for Healthcare Providers: IncredibleEmployment.be  This test is not yet approved or cleared by the [place] FDA and  has been authorized for detection and/or diagnosis of SARS-CoV-2 by FDA under an Emergency Use Authorization (EUA).  This EUA will remain in  effect (meaning this test can be used) for the duration of  the COVID-19 declaration under Section 564(b)(1) of the A ct, 21 U.S.C. section 360bbb-3(b)(1), unless the authorization is terminated or revoked sooner.     Influenza A by PCR NEGATIVE NEGATIVE Final   Influenza B by PCR NEGATIVE NEGATIVE Final    Comment: (NOTE) The Xpert Xpress SARS-CoV-2/FLU/RSV plus assay is intended as an aid in the diagnosis of influenza from Nasopharyngeal swab specimens and should not be used as a sole basis for treatment. Nasal washings and aspirates are unacceptable for Xpert Xpress SARS-CoV-2/FLU/RSV testing.  Fact Sheet for Patients: EntrepreneurPulse.com.au  Fact Sheet for Healthcare Providers: IncredibleEmployment.be  This test is not yet approved or cleared by the [place] FDA and has been authorized for detection and/or diagnosis of SARS-CoV-2 by FDA under an Emergency Use Authorization (EUA). This EUA will remain in effect (meaning this test can be used) for the duration of the COVID-19 declaration under Section 564(b)(1) of the Act, 21 U.S.C. section 360bbb-3(b)(1), unless the authorization is terminated or revoked.  Performed at [facility], [place] [address], [place], [place] [address]   Culture, blood (routine x 2)     Status: None (Preliminary result)   Collection Time: 07/24/22 11:20 AM   Specimen: BLOOD  Result Value Ref Range Status   Specimen Description BLOOD SITE NOT SPECIFIED  Final   Special Requests   Final    BOTTLES DRAWN AEROBIC AND ANAEROBIC Blood Culture adequate volume   Culture   Final    NO GROWTH 2 DAYS Performed at [facility], [address], [place], [place] [address]    Report Status PENDING  Incomplete     No results found.    Family Communication: Daughter at bedside  Disposition: Status is: Inpatient Remains inpatient appropriate because: Persistent severe hyponatremia, weakness Planned  Discharge Destination:  Home   Phillips Climes, MD 07/26/2022 1:25 PM Page by [email or web address]

## 2022-07-26 NOTE — TOC Initial Note (Signed)
Transition of Care [facility]) - Initial/Assessment Note    Patient Details  Name: Yvonne Rich MRN: [id number] Date of Birth: [birth year]  Transition of [facility]) CM/SW Contact:    Cyndi Bender, RN Phone Number: 07/26/2022, 11:46 AM  Clinical Narrative:                 Attempted to call room phone but no answer. Spoke to daughter ,  Vicente Males, regarding transition needs. Vicente Males is agreeable to home health. Patient lives alone but has family near by.  Patient has all needed DME. Offered choice for home health. Vicente Males deferred to this RNCM to find highly rated [place] agency. Cory with [facility] accepted referral. Patient has transportation to apts.   Address, Phone number and PCP verified.   TOC will continue to follow for needs.   Expected Discharge Plan: [place] Barriers to Discharge: Continued Medical Work up   Patient Goals and CMS Choice Patient states their goals for this hospitalization and ongoing recovery are:: return home CMS Medicare.gov Compare Post Acute Care list provided to:: Patient Represenative (must comment) Choice offered to / list presented to : Adult Children  Expected Discharge Plan and Services Expected Discharge Plan: [place]   Discharge Planning Services: CM Consult Post Acute Care Choice: [place] arrangements for the past 2 months: [place]: PT [place]: [place] Date [place]: 07/26/22 Time HH Agency Contacted: [id number] Representative spoke with at [place]: Tommi Rumps  Prior Living Arrangements/Services Living arrangements for the past 2 months: [place] with:: Self Patient language and need for interpreter reviewed:: Yes Do you feel safe going back to the place where you live?: Yes      Need for Family Participation in Patient Care: Yes (Comment) Care giver support system in place?: Yes (comment) Current home  services: DME Criminal Activity/Legal Involvement Pertinent to Current Situation/Hospitalization: No - Comment as needed  Activities of Daily Living Home Assistive Devices/Equipment: None ADL Screening (condition at time of admission) Patient's cognitive ability adequate to safely complete daily activities?: Yes Is the patient deaf or have difficulty hearing?: No Does the patient have difficulty seeing, even when wearing glasses/contacts?: No Does the patient have difficulty concentrating, remembering, or making decisions?: No Patient able to express need for assistance with ADLs?: Yes Does the patient have difficulty dressing or bathing?: No Independently performs ADLs?: Yes (appropriate for developmental age) Does the patient have difficulty walking or climbing stairs?: Yes Weakness of Legs: Both Weakness of Arms/Hands: None  Permission Sought/Granted         Permission granted to share info w AGENCY: [place]        Emotional Assessment       Orientation: : Oriented to Self, Oriented to Place, Oriented to  Time, Oriented to Situation Alcohol / Substance Use: Not Applicable Psych Involvement: No (comment)  Admission diagnosis:  Hyponatremia [E87.1] Patient Active Problem List   Diagnosis Date Noted   Hyponatremia 07/24/2022   Near syncope 07/24/2022   COVID-19 virus infection 07/24/2022   Chronic kidney disease, stage 3a ([place]) 07/24/2022   Fall 10/17/2016   Concussion 10/17/2016   Multiple facial fractures ([place]) 10/17/2016   Closed displaced fracture of distal phalanx of left great toe 10/17/2016   Closed left ankle  fracture, with routine healing, subsequent encounter 10/13/2016   Sciatica of right side    Allergy    Asthma    Glaucoma    OBESITY 09/17/2007   POLIOMYELITIS 05/24/2007   HYPERLIPIDEMIA 05/24/2007   Essential hypertension 05/24/2007   PCP:  Susy Frizzle, MD Pharmacy:   [facility] [place] [address] Phone: [phone] Fax: [phone]     Social Determinants of Health (SDOH) Interventions    Readmission Risk Interventions     No data to display

## 2022-07-26 NOTE — Progress Notes (Signed)
CSW following for SNF placement. COVID+ isolation status may be barrier for SNF. Currently only [facility] and [facility] accept COVID patients and they are not in network with [employer]. Full assessment to follow.  Gilmore Laroche, MSW, [facility]

## 2022-07-27 ENCOUNTER — Other Ambulatory Visit (HOSPITAL_COMMUNITY): Payer: Self-pay

## 2022-07-27 DIAGNOSIS — E871 Hypo-osmolality and hyponatremia: Secondary | ICD-10-CM | POA: Diagnosis not present

## 2022-07-27 DIAGNOSIS — R55 Syncope and collapse: Secondary | ICD-10-CM | POA: Diagnosis not present

## 2022-07-27 DIAGNOSIS — I10 Essential (primary) hypertension: Secondary | ICD-10-CM | POA: Diagnosis not present

## 2022-07-27 DIAGNOSIS — U071 COVID-19: Secondary | ICD-10-CM | POA: Diagnosis not present

## 2022-07-27 LAB — BASIC METABOLIC PANEL
Anion gap: 9 (ref 5–15)
BUN: 9 mg/dL (ref 8–23)
CO2: 22 mmol/L (ref 22–32)
Calcium: 9.3 mg/dL (ref 8.9–10.3)
Chloride: 98 mmol/L (ref 98–111)
Creatinine, Ser: 0.88 mg/dL (ref 0.44–1.00)
GFR, Estimated: >60 mL/min (ref >60)
Glucose, Bld: 97 mg/dL (ref 70–99)
Potassium: 3.9 mmol/L (ref 3.5–5.1)
Sodium: 129 mmol/L — ABNORMAL LOW (ref 135–145)

## 2022-07-27 MED ORDER — METOPROLOL TARTRATE 25 MG PO TABS
12.5000 mg | ORAL_TABLET | Freq: Two times a day (BID) | ORAL | 0 refills | Status: DC
  Filled 2022-07-27: qty 30, 30d supply, fill #0

## 2022-07-27 MED ORDER — HYDRALAZINE HCL 20 MG/ML IJ SOLN: 5.0000 mg | INTRAMUSCULAR | Status: DC | PRN

## 2022-07-27 MED ORDER — AMLODIPINE BESYLATE 5 MG PO TABS: 5.0000 mg | ORAL_TABLET | Freq: Every day | ORAL | Status: DC

## 2022-07-27 MED ORDER — DOXAZOSIN MESYLATE 2 MG PO TABS
2.0000 mg | ORAL_TABLET | Freq: Every day | ORAL | 0 refills | Status: DC
  Filled 2022-07-27: qty 30, 30d supply, fill #0

## 2022-07-27 MED ORDER — DOXAZOSIN MESYLATE 2 MG PO TABS
2.0000 mg | ORAL_TABLET | Freq: Every day | ORAL | Status: DC
  Administered 2022-07-27: 2 mg via ORAL
  Filled 2022-07-27: qty 1

## 2022-07-27 MED ORDER — ACETAMINOPHEN 325 MG PO TABS: 650.0000 mg | ORAL_TABLET | Freq: Four times a day (QID) | ORAL | Status: AC | PRN

## 2022-07-27 MED ORDER — LOSARTAN POTASSIUM 100 MG PO TABS
100.0000 mg | ORAL_TABLET | Freq: Every day | ORAL | 0 refills | Status: DC
  Filled 2022-07-27: qty 30, 30d supply, fill #0

## 2022-07-27 NOTE — Discharge Instructions (Signed)
Follow with Primary MD Susy Frizzle, MD in 7 days   Get CBC, CMP,checked  by Primary MD next visit.    Activity: As tolerated with Full fall precautions use walker/cane & assistance as needed   Disposition Home    Diet: Heart Healthy    On your next visit with your primary care physician please Get Medicines reviewed and adjusted.   Please request your Prim.MD to go over all Hospital Tests and Procedure/Radiological results at the follow up, please get all Hospital records sent to your Prim MD by signing hospital release before you go home.   If you experience worsening of your admission symptoms, develop shortness of breath, life threatening emergency, suicidal or homicidal thoughts you must seek medical attention immediately by calling 911 or calling your MD immediately  if symptoms less severe.  You Must read complete instructions/literature along with all the possible adverse reactions/side effects for all the Medicines you take and that have been prescribed to you. Take any new Medicines after you have completely understood and accpet all the possible adverse reactions/side effects.   Do not drive, operating heavy machinery, perform activities at heights, swimming or participation in water activities or provide baby sitting services if your were admitted for syncope or siezures until you have seen by Primary MD or a Neurologist and advised to do so again.  Do not drive when taking Pain medications.    Do not take more than prescribed Pain, Sleep and Anxiety Medications  Special Instructions: If you have smoked or chewed Tobacco  in the last 2 yrs please stop smoking, stop any regular Alcohol  and or any Recreational drug use.  Wear Seat belts while driving.   Please note  You were cared for by a hospitalist during your hospital stay. If you have any questions about your discharge medications or the care you received while you were in the hospital after you are  discharged, you can call the unit and asked to speak with the hospitalist on call if the hospitalist that took care of you is not available. Once you are discharged, your primary care physician will handle any further medical issues. Please note that NO REFILLS for any discharge medications will be authorized once you are discharged, as it is imperative that you return to your primary care physician (or establish a relationship with a primary care physician if you do not have one) for your aftercare needs so that they can reassess your need for medications and monitor your lab values.

## 2022-07-27 NOTE — Plan of Care (Signed)

## 2022-07-27 NOTE — Plan of Care (Signed)
  Problem: Education: Goal: Knowledge of General Education information will improve Description: Including pain rating scale, medication(s)/side effects and non-pharmacologic comfort measures Outcome: Adequate for Discharge   Problem: Health Behavior/Discharge Planning: Goal: Ability to manage health-related needs will improve Outcome: Adequate for Discharge   Problem: Clinical Measurements: Goal: Ability to maintain clinical measurements within normal limits will improve Outcome: Adequate for Discharge Goal: Will remain free from infection Outcome: Adequate for Discharge Goal: Diagnostic test results will improve Outcome: Adequate for Discharge Goal: Respiratory complications will improve Outcome: Adequate for Discharge Goal: Cardiovascular complication will be avoided Outcome: Adequate for Discharge   Problem: Activity: Goal: Risk for activity intolerance will decrease Outcome: Adequate for Discharge   Problem: Nutrition: Goal: Adequate nutrition will be maintained Outcome: Adequate for Discharge   Problem: Coping: Goal: Level of anxiety will decrease Outcome: Adequate for Discharge   Problem: Elimination: Goal: Will not experience complications related to bowel motility Outcome: Adequate for Discharge Goal: Will not experience complications related to urinary retention Outcome: Adequate for Discharge   Problem: Pain Managment: Goal: General experience of comfort will improve Outcome: Adequate for Discharge   Problem: Safety: Goal: Ability to remain free from injury will improve Outcome: Adequate for Discharge   Problem: Skin Integrity: Goal: Risk for impaired skin integrity will decrease Outcome: Adequate for Discharge   Problem: Education: Goal: Knowledge of condition and prescribed therapy will improve Outcome: Adequate for Discharge   Problem: Cardiac: Goal: Will achieve and/or maintain adequate cardiac output Outcome: Adequate for Discharge   Problem:  Physical Regulation: Goal: Complications related to the disease process, condition or treatment will be avoided or minimized Outcome: Adequate for Discharge   

## 2022-07-27 NOTE — TOC Transition Note (Signed)
Transition of Care [facility]) - CM/SW Discharge Note   Patient Details  Name: Yvonne Rich MRN: [id number] Date of Birth: 09-22-1941  [facility] CM/SW Contact:  Cyndi Bender, RN Phone Number: 07/27/2022, 10:29 AM   Clinical Narrative:    Patient stable for discharge.  Daughter at bedside to transport home.  Cory with [facility] notified of discharge.  No other TOC needs.    Final next level of care: [place] Barriers to Discharge: Barriers Resolved   Patient Goals and CMS Choice Patient states their goals for this hospitalization and ongoing recovery are:: return home CMS Medicare.gov Compare Post Acute Care list provided to:: Patient Represenative (must comment) Choice offered to / list presented to : Adult Children  Discharge Placement               home      Discharge Plan and Services   Discharge Planning Services: CM Consult Post Acute Care Choice: Home Health                    HH Arranged: PT [place]: [place] Date [place]: 07/26/22 Time [place]: [id number] Representative spoke with at [place]: [place] Determinants of Health ([place]) Interventions     Readmission Risk Interventions    07/27/2022   10:28 AM  Readmission Risk Prevention Plan  Post Dischage Appt Complete  Medication Screening Complete  Transportation Screening Complete

## 2022-07-27 NOTE — Discharge Summary (Signed)
Physician Discharge Summary  Yvonne Rich MLY:[id number] DOB: 10/22/1940 DOA: 07/24/2022  PCP: Susy Frizzle, MD  Admit date: 07/24/2022 Discharge date: 07/27/2022  Admitted From: Home Disposition:  Home , initial recommendation for SNF, but after discussion with the family they would rather take patient home with maximum home health.  Recommendations for Outpatient Follow-up:  Follow up with PCP in 1-2 weeks Please obtain BMP/CBC in one week Please continue to monitor and adjust antihypertensive regimen as needed  Home Health: YES Discharge Condition:Stable CODE STATUS:FULL Diet recommendation: Heart Healthy  Brief/Interim Summary:    Yvonne Rich is an 81 y.o. female with a history of stage III CKD, HTN, glaucoma, recent covid-19 diagnosed by home test 10/8 treated with ivermectin and azithromycin who presented to the ED 10/16 after 2 episodes where she was unresponsive in the setting of more than a week of diffuse weakness, and more recent nausea, vomiting, and diarrhea. She was found to be dehydrated, hyponatremic with nonfocal neurological exam. IV fluids were given, and HCTZ and ivermectin stopped. Sodium level improved but has worsened overnight, she remains markedly weak/fatigued with cough but no dyspnea or pneumonia on CXR.    Near syncope:  - Is not technically syncope as she did not lose postural tone. Continues to have nonfocal exam.  But she was dehydrated, volume depleted on presentation, as well she was noted to have bradycardia.  Could not obtain orthostatic initially as unable to stand long enough for this for definite drop. -Patient was appropriately hydrated during hospital stay, she was monitored on telemetry, hold her metoprolol dose has been lowered, no further bradycardia, and no recurrent symptoms.  Sinus bradycardia  -He was noted to have sinus bradycardia heart rate in the 40s to 50s, her metoprolol was decreased from 25 mg twice daily to 12.5  mg twice daily, bradycardia has improved heart rate currently in the 60s to 70s while awake, she is asymptomatic, no dropped beats, or pauses on telemetry, she has first-degree AVB.   Hyponatremia:  -Is most likely due to poor oral intake, in the setting of patient resuming her hydrochlorothiazide as an outpatient -This has significantly improved, sodium is 129 on discharge -Hydrochlorothiazide discontinued on discharge.   Covid-19 infection:  - No evidence of pneumonia on CXR, though has classic persistent hacking cough. Symptoms several days before, but initially tested positive on 10/8. Reportedly vaccinated.  - Has been treated by Dr. Lenise Arena in [place], [place] with ivermectin (dose unknown) and azithromycin. Had vomiting and diarrhea shortly after initiating this.  -She tested positive on admission/16, had 10 days isolation.  Has ended 10/18   Weakness:  Diffuse, nonfocal on exam. Related to covid-19, at risk for such due to age.  - PT/OT consulted.  Commendation for SNF placement, but as discussed with the daughter they  rather go home with home health.   HTN:  -BPs becoming elevated after fluid resuscitation.  -This appears to be a significant problem as an outpatient with multiple adjustment of her antihypertensive regimen, so hydrochlorothiazide has been discontinued due to hyponatremia, prolonged dose has been decreased due to bradycardia, she was instructed to resume her Cardura which was started recently by PCP.  Discharge Diagnoses:  Principal Problem:   Near syncope Active Problems:   Essential hypertension   Hyponatremia   COVID-19 virus infection   Chronic kidney disease, stage 3a [facility])    Discharge Instructions  Discharge Instructions     Diet - low sodium heart healthy   Complete  by: As directed    Discharge instructions   Complete by: As directed    Follow with Primary MD Susy Frizzle, MD in 7 days   Get CBC, CMP,checked  by Primary MD next  visit.    Activity: As tolerated with Full fall precautions use walker/cane & assistance as needed   Disposition Home    Diet: Heart Healthy    On your next visit with your primary care physician please Get Medicines reviewed and adjusted.   Please request your Prim.MD to go over all Hospital Tests and Procedure/Radiological results at the follow up, please get all Hospital records sent to your Prim MD by signing hospital release before you go home.   If you experience worsening of your admission symptoms, develop shortness of breath, life threatening emergency, suicidal or homicidal thoughts you must seek medical attention immediately by calling 911 or calling your MD immediately  if symptoms less severe.  You Must read complete instructions/literature along with all the possible adverse reactions/side effects for all the Medicines you take and that have been prescribed to you. Take any new Medicines after you have completely understood and accpet all the possible adverse reactions/side effects.   Do not drive, operating heavy machinery, perform activities at heights, swimming or participation in water activities or provide baby sitting services if your were admitted for syncope or siezures until you have seen by Primary MD or a Neurologist and advised to do so again.  Do not drive when taking Pain medications.    Do not take more than prescribed Pain, Sleep and Anxiety Medications  Special Instructions: If you have smoked or chewed Tobacco  in the last 2 yrs please stop smoking, stop any regular Alcohol  and or any Recreational drug use.  Wear Seat belts while driving.   Please note  You were cared for by a hospitalist during your hospital stay. If you have any questions about your discharge medications or the care you received while you were in the hospital after you are discharged, you can call the unit and asked to speak with the hospitalist on call if the hospitalist that  took care of you is not available. Once you are discharged, your primary care physician will handle any further medical issues. Please note that NO REFILLS for any discharge medications will be authorized once you are discharged, as it is imperative that you return to your primary care physician (or establish a relationship with a primary care physician if you do not have one) for your aftercare needs so that they can reassess your need for medications and monitor your lab values.   Increase activity slowly   Complete by: As directed       Allergies as of 07/27/2022       Reactions   Latex Rash   Penicillins Other (See Comments)   Breaking out around mouth   Erythromycin Rash   Sulfonamide Derivatives Rash        Medication List     STOP taking these medications    losartan-hydrochlorothiazide 100-25 MG tablet Commonly known as: HYZAAR       TAKE these medications    acetaminophen 325 MG tablet Commonly known as: TYLENOL Take 2 tablets (650 mg total) by mouth every 6 (six) hours as needed for mild pain (or Fever >/= 101). What changed:  medication strength how much to take reasons to take this   albuterol 108 (90 Base) MCG/ACT inhaler Commonly known as: ProAir HFA Inhale  2 puffs into the lungs every 6 (six) hours as needed for wheezing or shortness of breath.   ALPRAZolam 0.5 MG tablet Commonly known as: XANAX TAKE 1 TABLET BY MOUTH AT BEDTIME AS NEEDED FOR ANXIETY OR SLEEP What changed: See the new instructions.   aspirin 81 MG tablet Take 81 mg by mouth daily.   COUGH DROPS MT Use as directed 1 lozenge in the mouth or throat 3 (three) times daily as needed (cough).   cyanocobalamin 1000 MCG tablet Commonly known as: VITAMIN B12 Take 1,000 mcg by mouth daily.   doxazosin 2 MG tablet Commonly known as: Cardura Take 1 tablet (2 mg total) by mouth daily. Stop amlodipine   Dramamine 50 MG tablet Generic drug: dimenhyDRINATE Take 50 mg by mouth every 8  (eight) hours as needed for nausea.   guaiFENesin 600 MG 12 hr tablet Commonly known as: MUCINEX Take 600 mg by mouth 2 (two) times daily as needed for cough.   loratadine 10 MG tablet Commonly known as: CLARITIN Take 10 mg by mouth daily.   losartan 100 MG tablet Commonly known as: COZAAR Take 1 tablet (100 mg total) by mouth daily.   metoprolol tartrate 25 MG tablet Commonly known as: LOPRESSOR Take 0.5 tablets (12.5 mg total) by mouth 2 (two) times daily. What changed: how much to take   oxymetazoline 0.05 % nasal spray Commonly known as: AFRIN Place 1 spray into both nostrils 2 (two) times daily as needed for congestion.   Vitamin D (Cholecalciferol) 25 MCG (1000 UT) Caps Take 2,000 Units by mouth daily.        Follow-up Information     [facility] Follow [facility].   Specialty: Home Health Services Why: Home health has been arranged. They will contact you within 48 hours post discharge to schedule apt. Contact information: [address] [place] [address] [phone]                Allergies  Allergen Reactions   Latex Rash   Penicillins Other (See Comments)    Breaking out around mouth   Erythromycin Rash   Sulfonamide Derivatives Rash    Consultations: None   Procedures/Studies: DG Chest Port 1 View  Result Date: 07/24/2022 CLINICAL DATA:  Weakness.  COVID diagnosis EXAM: PORTABLE CHEST 1 VIEW COMPARISON:  None Available. FINDINGS: Normal mediastinum and cardiac silhouette. Normal pulmonary vasculature. No evidence of effusion, infiltrate, or pneumothorax. No acute bony abnormality. Atherosclerotic calcification of the aorta. IMPRESSION: 1.  No acute cardiopulmonary process. 2.  Aortic Atherosclerosis (ICD10-I70.0). Electronically Signed   By: Suzy Bouchard M.D.   On: 07/24/2022 11:51     Subjective: No significant events overnight as discussed with staff, patient reports she still feeling weak  Discharge  Exam: Vitals:   07/26/22 2051 07/27/22 0905  BP: (!) 165/72 (!) 172/72  Pulse: 72 75  Resp: 17 16  Temp: 98 F (36.7 C)   SpO2: 98% 99%   Vitals:   07/26/22 1920 07/26/22 1928 07/26/22 2051 07/27/22 0905  BP: (!) 186/82 (!) 186/82 (!) 165/72 (!) 172/72  Pulse:   72 75  Resp:   17 16  Temp: 98.1 F (36.7 C)  98 F (36.7 C)   TempSrc: Oral  Oral   SpO2: 95%  98% 99%  Weight:      Height:        General: Pt is alert, awake, not in acute distress Cardiovascular: RRR, S1/S2 +, no rubs, no gallops  Respiratory: CTA bilaterally, no wheezing, no rhonchi Abdominal: Soft, NT, ND, bowel sounds + Extremities: no edema, no cyanosis    The results of significant diagnostics from this hospitalization (including imaging, microbiology, ancillary and laboratory) are listed below for reference.     Microbiology: Recent Results (from the past 240 hour(s))  Culture, blood (routine x 2)     Status: None (Preliminary result)   Collection Time: 07/24/22 11:15 AM   Specimen: BLOOD  Result Value Ref Range Status   Specimen Description BLOOD SITE NOT SPECIFIED  Final   Special Requests   Final    BOTTLES DRAWN AEROBIC AND ANAEROBIC Blood Culture adequate volume   Culture   Final    NO GROWTH 3 DAYS Performed at [facility], [address], [place], [place] [address]    Report Status PENDING  Incomplete  Urine Culture     Status: Abnormal   Collection Time: 07/24/22 11:15 AM   Specimen: Urine, Clean Catch  Result Value Ref Range Status   Specimen Description URINE, CLEAN CATCH  Final   Special Requests   Final    NONE Performed at [facility], [place] [address] [place], [place] [address]    Culture MULTIPLE SPECIES PRESENT, SUGGEST RECOLLECTION (A)  Final   Report Status 07/25/2022 FINAL  Final  Resp Panel by RT-PCR (Flu A&B, Covid) Anterior Nasal Swab     Status: Abnormal   Collection Time: 07/24/22 11:16 AM   Specimen: Anterior Nasal Swab  Result Value Ref Range Status    SARS Coronavirus 2 by RT PCR POSITIVE (A) NEGATIVE Final    Comment: (NOTE) SARS-CoV-2 target nucleic acids are DETECTED.  The SARS-CoV-2 RNA is generally detectable in upper respiratory specimens during the acute phase of infection. Positive results are indicative of the presence of the identified virus, but do not rule out bacterial infection or co-infection with other pathogens not detected by the test. Clinical correlation with patient history and other diagnostic information is necessary to determine patient infection status. The expected result is Negative.  Fact Sheet for Patients: EntrepreneurPulse.com.au  Fact Sheet for Healthcare Providers: IncredibleEmployment.be  This test is not yet approved or cleared by the [place] FDA and  has been authorized for detection and/or diagnosis of SARS-CoV-2 by FDA under an Emergency Use Authorization (EUA).  This EUA will remain in effect (meaning this test can be used) for the duration of  the COVID-19 declaration under Section 564(b)(1) of the A ct, 21 U.S.C. section 360bbb-3(b)(1), unless the authorization is terminated or revoked sooner.     Influenza A by PCR NEGATIVE NEGATIVE Final   Influenza B by PCR NEGATIVE NEGATIVE Final    Comment: (NOTE) The Xpert Xpress SARS-CoV-2/FLU/RSV plus assay is intended as an aid in the diagnosis of influenza from Nasopharyngeal swab specimens and should not be used as a sole basis for treatment. Nasal washings and aspirates are unacceptable for Xpert Xpress SARS-CoV-2/FLU/RSV testing.  Fact Sheet for Patients: EntrepreneurPulse.com.au  Fact Sheet for Healthcare Providers: IncredibleEmployment.be  This test is not yet approved or cleared by the [place] FDA and has been authorized for detection and/or diagnosis of SARS-CoV-2 by FDA under an Emergency Use Authorization (EUA). This EUA will remain in effect  (meaning this test can be used) for the duration of the COVID-19 declaration under Section 564(b)(1) of the Act, 21 U.S.C. section 360bbb-3(b)(1), unless the authorization is terminated or revoked.  Performed at [facility], [place] [address]  [place], [place] [address]   Culture, blood (routine x 2)     Status: None (Preliminary result)   Collection Time: 07/24/22 11:20 AM   Specimen: BLOOD  Result Value Ref Range Status   Specimen Description BLOOD SITE NOT SPECIFIED  Final   Special Requests   Final    BOTTLES DRAWN AEROBIC AND ANAEROBIC Blood Culture adequate volume   Culture   Final    NO GROWTH 3 DAYS Performed at [facility], [address] [place], [place] [address]    Report Status PENDING  Incomplete     Labs: BNP (last 3 results) No results for input(s): "BNP" in the last 8760 hours. Basic Metabolic Panel: Recent Labs  Lab 07/24/22 1248 07/24/22 1442 07/25/22 1300 07/25/22 1849 07/26/22 0019 07/26/22 0701 07/27/22 0722  NA  --    < > 127* 127* 126* 128* 129*  K  --    < > 4.7 3.9 3.9 3.7 3.9  CL  --    < > 97* 97* 98 100 98  CO2  --    < > 22 21* 19* 21* 22  GLUCOSE  --    < > 104* 104* 90 86 97  BUN  --    < > '12 12 12 9 9  '$ CREATININE  --    < > 1.03* 1.00 0.83 0.83 0.88  CALCIUM  --    < > 9.0 9.0 8.9 9.3 9.3  MG 1.4*  --   --   --   --   --   --   PHOS 2.6  --   --   --   --   --   --    < > = values in this interval not displayed.   Liver Function Tests: Recent Labs  Lab 07/24/22 1138  AST 18  ALT 12  ALKPHOS 59  BILITOT 1.2  PROT 6.6  ALBUMIN 4.0   No results for input(s): "LIPASE", "AMYLASE" in the last 168 hours. No results for input(s): "AMMONIA" in the last 168 hours. CBC: Recent Labs  Lab 07/24/22 1146 07/24/22 1247 07/25/22 0408  WBC  --  4.0 5.0  NEUTROABS  --  2.8  --   HGB 13.9 12.1 11.6*  HCT 41.0 32.5* 31.8*  MCV  --  92.9 93.3  PLT  --  285 304   Cardiac Enzymes: No results for input(s): "CKTOTAL",  "CKMB", "CKMBINDEX", "TROPONINI" in the last 168 hours. BNP: Invalid input(s): "POCBNP" CBG: No results for input(s): "GLUCAP" in the last 168 hours. D-Dimer No results for input(s): "DDIMER" in the last 72 hours. Hgb A1c No results for input(s): "HGBA1C" in the last 72 hours. Lipid Profile No results for input(s): "CHOL", "HDL", "LDLCALC", "TRIG", "CHOLHDL", "LDLDIRECT" in the last 72 hours. Thyroid function studies No results for input(s): "TSH", "T4TOTAL", "T3FREE", "THYROIDAB" in the last 72 hours.  Invalid input(s): "FREET3" Anemia work up No results for input(s): "VITAMINB12", "FOLATE", "FERRITIN", "TIBC", "IRON", "RETICCTPCT" in the last 72 hours. Urinalysis    Component Value Date/Time   COLORURINE STRAW (A) 07/24/2022 1442   APPEARANCEUR CLEAR 07/24/2022 1442   LABSPEC 1.005 07/24/2022 1442   PHURINE 6.0 07/24/2022 1442   GLUCOSEU NEGATIVE 07/24/2022 [place] 07/24/2022 [place] 07/24/2022 1442   KETONESUR NEGATIVE 07/24/2022 1442   PROTEINUR NEGATIVE 07/24/2022 1442   NITRITE NEGATIVE 07/24/2022 1442   LEUKOCYTESUR NEGATIVE 07/24/2022 1442   Sepsis Labs Recent Labs  Lab 07/24/22  1247 07/25/22 0408  WBC 4.0 5.0   Microbiology Recent Results (from the past 240 hour(s))  Culture, blood (routine x 2)     Status: None (Preliminary result)   Collection Time: 07/24/22 11:15 AM   Specimen: BLOOD  Result Value Ref Range Status   Specimen Description BLOOD SITE NOT SPECIFIED  Final   Special Requests   Final    BOTTLES DRAWN AEROBIC AND ANAEROBIC Blood Culture adequate volume   Culture   Final    NO GROWTH 3 DAYS Performed at [facility], [address] [place], [place] [address]    Report Status PENDING  Incomplete  Urine Culture     Status: Abnormal   Collection Time: 07/24/22 11:15 AM   Specimen: Urine, Clean Catch  Result Value Ref Range Status   Specimen Description URINE, CLEAN CATCH  Final   Special Requests    Final    NONE Performed at [facility], [place] [address], [place], [place] [address]    Culture MULTIPLE SPECIES PRESENT, SUGGEST RECOLLECTION (A)  Final   Report Status 07/25/2022 FINAL  Final  Resp Panel by RT-PCR (Flu A&B, Covid) Anterior Nasal Swab     Status: Abnormal   Collection Time: 07/24/22 11:16 AM   Specimen: Anterior Nasal Swab  Result Value Ref Range Status   SARS Coronavirus 2 by RT PCR POSITIVE (A) NEGATIVE Final    Comment: (NOTE) SARS-CoV-2 target nucleic acids are DETECTED.  The SARS-CoV-2 RNA is generally detectable in upper respiratory specimens during the acute phase of infection. Positive results are indicative of the presence of the identified virus, but do not rule out bacterial infection or co-infection with other pathogens not detected by the test. Clinical correlation with patient history and other diagnostic information is necessary to determine patient infection status. The expected result is Negative.  Fact Sheet for Patients: EntrepreneurPulse.com.au  Fact Sheet for Healthcare Providers: IncredibleEmployment.be  This test is not yet approved or cleared by the [place] FDA and  has been authorized for detection and/or diagnosis of SARS-CoV-2 by FDA under an Emergency Use Authorization (EUA).  This EUA will remain in effect (meaning this test can be used) for the duration of  the COVID-19 declaration under Section 564(b)(1) of the A ct, 21 U.S.C. section 360bbb-3(b)(1), unless the authorization is terminated or revoked sooner.     Influenza A by PCR NEGATIVE NEGATIVE Final   Influenza B by PCR NEGATIVE NEGATIVE Final    Comment: (NOTE) The Xpert Xpress SARS-CoV-2/FLU/RSV plus assay is intended as an aid in the diagnosis of influenza from Nasopharyngeal swab specimens and should not be used as a sole basis for treatment. Nasal washings and aspirates are unacceptable for Xpert Xpress  SARS-CoV-2/FLU/RSV testing.  Fact Sheet for Patients: EntrepreneurPulse.com.au  Fact Sheet for Healthcare Providers: IncredibleEmployment.be  This test is not yet approved or cleared by the [place] FDA and has been authorized for detection and/or diagnosis of SARS-CoV-2 by FDA under an Emergency Use Authorization (EUA). This EUA will remain in effect (meaning this test can be used) for the duration of the COVID-19 declaration under Section 564(b)(1) of the Act, 21 U.S.C. section 360bbb-3(b)(1), unless the authorization is terminated or revoked.  Performed at [facility], [place] [address] [place], [place] [address]   Culture, blood (routine x 2)     Status: None (Preliminary result)   Collection Time: 07/24/22 11:20 AM   Specimen: BLOOD  Result Value Ref Range Status  Specimen Description BLOOD SITE NOT SPECIFIED  Final   Special Requests   Final    BOTTLES DRAWN AEROBIC AND ANAEROBIC Blood Culture adequate volume   Culture   Final    NO GROWTH 3 DAYS Performed at [facility], [address], [place], [place] [address]    Report Status PENDING  Incomplete     Time coordinating discharge: Over 30 minutes  SIGNED:   Phillips Climes, MD  [facility] 07/27/2022, 10:10 AM Pager   If 7PM-7AM, please contact night-coverage [email or web address]

## 2022-07-29 LAB — CULTURE, BLOOD (ROUTINE X 2)
Culture: NO GROWTH
Culture: NO GROWTH
Special Requests: ADEQUATE
Special Requests: ADEQUATE

## 2022-08-01 ENCOUNTER — Other Ambulatory Visit: Payer: Self-pay | Admitting: Family Medicine

## 2022-08-01 ENCOUNTER — Telehealth: Payer: Self-pay

## 2022-08-01 MED ORDER — HYDROCODONE BIT-HOMATROP MBR 5-1.5 MG/5ML PO SOLN: 5.0000 mL | Freq: Three times a day (TID) | ORAL | 0 refills | Status: DC | PRN

## 2022-08-01 NOTE — Telephone Encounter (Signed)
BP READINGS AND COUGH:  Call from Vicente Males, pt's daugther, for hospital f/u appointment and rx for cough medication. Pt c/o dry cough since d/c from hospital. I advised they can try Coriciden HBP OTC. Vicente Males asks what you would suggest?  PT, Yvonne Rich with [facility], was there while I was talking with Vicente Males. Pt's BP was 200/100, 180/80 and 160/80 during visit. No other sx. Pt has hospital f/u scheduled for Friday 10/27 and I asked Yvonne Rich to have Vicente Males check her mother's BP's and bring readings to office on Friday. Thank you.

## 2022-08-04 ENCOUNTER — Ambulatory Visit (INDEPENDENT_AMBULATORY_CARE_PROVIDER_SITE_OTHER): Payer: Medicare HMO | Admitting: Family Medicine

## 2022-08-04 VITALS — BP 152/76 | HR 52 | Wt 185.6 lb

## 2022-08-04 DIAGNOSIS — N183 Chronic kidney disease, stage 3 unspecified: Secondary | ICD-10-CM

## 2022-08-04 DIAGNOSIS — U071 COVID-19: Secondary | ICD-10-CM

## 2022-08-04 DIAGNOSIS — E871 Hypo-osmolality and hyponatremia: Secondary | ICD-10-CM | POA: Diagnosis not present

## 2022-08-04 DIAGNOSIS — R55 Syncope and collapse: Secondary | ICD-10-CM | POA: Diagnosis not present

## 2022-08-04 MED ORDER — ALBUTEROL SULFATE HFA 108 (90 BASE) MCG/ACT IN AERS: 2.0000 | INHALATION_SPRAY | Freq: Four times a day (QID) | RESPIRATORY_TRACT | 1 refills | Status: DC | PRN

## 2022-08-04 MED ORDER — BENZONATATE 100 MG PO CAPS: 200.0000 mg | ORAL_CAPSULE | Freq: Three times a day (TID) | ORAL | 0 refills | Status: DC | PRN

## 2022-08-04 NOTE — Progress Notes (Signed)
Subjective:    Patient ID: Yvonne Rich, female    DOB: 16-Jun-1941, 81 y.o.   MRN: [id number]  HPI Admit date: 07/24/2022 Discharge date: 07/27/2022    Yvonne Rich is an 81 y.o. female with a history of stage III CKD, HTN, glaucoma, recent covid-19 diagnosed by home test 10/8 treated with ivermectin and azithromycin who presented to the ED 10/16 after 2 episodes where she was unresponsive in the setting of more than a week of diffuse weakness, and more recent nausea, vomiting, and diarrhea. She was found to be dehydrated, hyponatremic with nonfocal neurological exam. IV fluids were given, and HCTZ and ivermectin stopped. Sodium level improved but has worsened overnight, she remains markedly weak/fatigued with cough but no dyspnea or pneumonia on CXR.      Near syncope:  - Is not technically syncope as she did not lose postural tone. Continues to have nonfocal exam.  But she was dehydrated, volume depleted on presentation, as well she was noted to have bradycardia.  Could not obtain orthostatic initially as unable to stand long enough for this for definite drop. -Patient was appropriately hydrated during hospital stay, she was monitored on telemetry, hold her metoprolol dose has been lowered, no further bradycardia, and no recurrent symptoms.   Sinus bradycardia  -He was noted to have sinus bradycardia heart rate in the 40s to 50s, her metoprolol was decreased from 25 mg twice daily to 12.5 mg twice daily, bradycardia has improved heart rate currently in the 60s to 70s while awake, she is asymptomatic, no dropped beats, or pauses on telemetry, she has first-degree AVB.    Hyponatremia:  -Is most likely due to poor oral intake, in the setting of patient resuming her hydrochlorothiazide as an outpatient -This has significantly improved, sodium is 129 on discharge -Hydrochlorothiazide discontinued on discharge.   Covid-19 infection:  - No evidence of pneumonia on CXR, though has  classic persistent hacking cough. Symptoms several days before, but initially tested positive on 10/8. Reportedly vaccinated.  - Has been treated by Dr. Lenise Arena in [place], [place] with ivermectin (dose unknown) and azithromycin. Had vomiting and diarrhea shortly after initiating this.  -She tested positive on admission/16, had 10 days isolation.  Has ended 10/18    Weakness:  Diffuse, nonfocal on exam. Related to covid-19, at risk for such due to age.  - PT/OT consulted.  Commendation for SNF placement, but as discussed with the daughter they  rather go home with home health.   HTN:  -BPs becoming elevated after fluid resuscitation.  -This appears to be a significant problem as an outpatient with multiple adjustment of her antihypertensive regimen, so hydrochlorothiazide has been discontinued due to hyponatremia, prolonged dose has been decreased due to bradycardia, she was instructed to resume her Cardura which was started recently by PCP.   Discharge Diagnoses:  Principal Problem:   Near syncope Active Problems:   Essential hypertension   Hyponatremia   COVID-19 virus infection   Chronic kidney disease, stage 3a ([place])    08/04/22 Patient continues to have a frequent irritant cough.  Throughout her encounter today she is coughing.  The cough is nonproductive.  She has faint expiratory wheezing in all 4 lobes..  She denies any chest pain.  She denies any shortness of breath.  She is only taking Hycodan at night because it makes her sleepy.  She does not have an inhaler.  She is not taking any cough medicine during the day.  Her blood  pressure today is elevated and her heart rate is quite low at 52 bpm.  She still taking metoprolol half a pill twice a day in addition to losartan and doxazosin 2 mg a day.  She denies any syncope or near syncope.  She is working with physical therapy.  She denies any dysuria.  She denies any abdominal pain.  She denies any leg swelling.  She denies any  fevers or chills Past Medical History:  Diagnosis Date   Allergy    Asthma    CKD (chronic kidney disease) stage 3, GFR 30-59 ml/min (HCC)    Glaucoma    Hypertension    Poliomyelitis    Sciatica of right side    Past Surgical History:  Procedure Laterality Date   CHOLECYSTECTOMY  2002   COSMETIC SURGERY  2012   eye lids   ROTATOR CUFF REPAIR  1994   Current Outpatient Medications on File Prior to Visit  Medication Sig Dispense Refill   acetaminophen (TYLENOL) 325 MG tablet Take 2 tablets (650 mg total) by mouth every 6 (six) hours as needed for mild pain (or Fever >/= 101).     albuterol (PROAIR HFA) 108 (90 BASE) MCG/ACT inhaler Inhale 2 puffs into the lungs every 6 (six) hours as needed for wheezing or shortness of breath. 1 Inhaler 1   ALPRAZolam (XANAX) 0.5 MG tablet TAKE 1 TABLET BY MOUTH AT BEDTIME AS NEEDED FOR ANXIETY OR SLEEP (Patient taking differently: Take 0.5 mg by mouth at bedtime as needed for anxiety or sleep.) 30 tablet 2   aspirin 81 MG tablet Take 81 mg by mouth daily.     cyanocobalamin (VITAMIN B12) 1000 MCG tablet Take 1,000 mcg by mouth daily.     dimenhyDRINATE (DRAMAMINE) 50 MG tablet Take 50 mg by mouth every 8 (eight) hours as needed for nausea.     doxazosin (CARDURA) 2 MG tablet Take 1 tablet (2 mg total) by mouth daily. Stop amlodipine 30 tablet 0   guaiFENesin (MUCINEX) 600 MG 12 hr tablet Take 600 mg by mouth 2 (two) times daily as needed for cough.     HYDROcodone bit-homatropine (HYCODAN) 5-1.5 MG/5ML syrup Take 5 mLs by mouth every 8 (eight) hours as needed for cough. 120 mL 0   loratadine (CLARITIN) 10 MG tablet Take 10 mg by mouth daily.     losartan (COZAAR) 100 MG tablet Take 1 tablet (100 mg total) by mouth daily. 30 tablet 0   metoprolol tartrate (LOPRESSOR) 25 MG tablet Take 0.5 tablets (12.5 mg total) by mouth 2 (two) times daily. 30 tablet 0   oxymetazoline (AFRIN) 0.05 % nasal spray Place 1 spray into both nostrils 2 (two) times daily as  needed for congestion.     Throat Lozenges (COUGH DROPS MT) Use as directed 1 lozenge in the mouth or throat 3 (three) times daily as needed (cough).     Vitamin D, Cholecalciferol, 1000 units CAPS Take 2,000 Units by mouth daily.      No current facility-administered medications on file prior to visit.   Allergies  Allergen Reactions   Latex Rash   Penicillins Other (See Comments)    Breaking out around mouth   Erythromycin Rash   Sulfonamide Derivatives Rash   Social History   Socioeconomic History   Marital status: Married    Spouse name: Not on file   Number of children: Not on file   Years of education: Not on file   Highest education level: Not on  file  Occupational History   Occupation: retired  Tobacco Use   Smoking status: Never   Smokeless tobacco: Never  Substance and Sexual Activity   Alcohol use: No   Drug use: No   Sexual activity: Yes  Other Topics Concern   Not on file  Social History Narrative   Not on file   Social Determinants of Health   Financial Resource Strain: Not on file  Food Insecurity: No Food Insecurity (07/25/2022)   Hunger Vital Sign    Worried About Running Out of Food in the Last Year: Never true    Ran Out of Food in the Last Year: Never true  Transportation Needs: No Transportation Needs (07/25/2022)   PRAPARE - Hydrologist (Medical): No    Lack of Transportation (Non-Medical): No  Physical Activity: Not on file  Stress: Not on file  Social Connections: Not on file  Intimate Partner Violence: Not At Risk (07/25/2022)   Humiliation, Afraid, Rape, and Kick questionnaire    Fear of Current or Ex-Partner: No    Emotionally Abused: No    Physically Abused: No    Sexually Abused: No   Family History  Problem Relation Age of Onset   Hypertension Father    Vision loss Father       Review of Systems     Objective:   Physical Exam Constitutional:      General: She is not in acute distress.     Appearance: Normal appearance. She is normal weight. She is not ill-appearing, toxic-appearing or diaphoretic.  HENT:     Head: Normocephalic and atraumatic.     Right Ear: External ear normal.     Left Ear: External ear normal.     Nose: Nose normal. No congestion or rhinorrhea.     Mouth/Throat:     Mouth: Mucous membranes are moist.     Pharynx: Oropharynx is clear. No oropharyngeal exudate.  Eyes:     Extraocular Movements: Extraocular movements intact.     Conjunctiva/sclera: Conjunctivae normal.     Pupils: Pupils are equal, round, and reactive to light.  Neck:     Vascular: No carotid bruit.  Cardiovascular:     Rate and Rhythm: Regular rhythm. Bradycardia present.     Pulses: Normal pulses.     Heart sounds: Normal heart sounds. No murmur heard.    No friction rub. No gallop.  Pulmonary:     Effort: Pulmonary effort is normal. No respiratory distress.     Breath sounds: No stridor. Wheezing present. No rhonchi or rales.  Abdominal:     General: Abdomen is flat. Bowel sounds are normal. There is no distension.     Palpations: Abdomen is soft. There is no mass.     Tenderness: There is no abdominal tenderness. There is no guarding or rebound.     Hernia: No hernia is present.  Musculoskeletal:        General: No swelling, tenderness, deformity or signs of injury. Normal range of motion.     Cervical back: Neck supple. No rigidity or tenderness.     Right lower leg: No edema.     Left lower leg: No edema.  Lymphadenopathy:     Cervical: No cervical adenopathy.  Skin:    General: Skin is warm.     Coloration: Skin is not jaundiced or pale.     Findings: No bruising, erythema, lesion or rash.  Neurological:     General: No focal  deficit present.     Mental Status: She is alert and oriented to person, place, and time. Mental status is at baseline.     Cranial Nerves: No cranial nerve deficit.     Sensory: No sensory deficit.     Motor: No weakness.     Coordination:  Coordination normal.     Gait: Gait normal.     Deep Tendon Reflexes: Reflexes normal.  Psychiatric:        Mood and Affect: Mood normal.        Behavior: Behavior normal.        Thought Content: Thought content normal.        Judgment: Judgment normal.           Assessment & Plan:  Hyponatremia - Plan: CBC with Differential/Platelet, COMPLETE METABOLIC PANEL WITH GFR  Near syncope - Plan: CBC with Differential/Platelet, COMPLETE METABOLIC PANEL WITH GFR  [id number] virus infection  Stage 3 chronic kidney disease, unspecified whether stage 3a or 3b CKD (HCC) - Plan: CBC with Differential/Platelet, COMPLETE METABOLIC PANEL WITH GFR Discontinue metoprolol due to bradycardia.  Continue losartan.  Increase doxazosin to 4 mg a day to compensate for the elevated blood pressure.  Recheck heart rate and blood pressure next week.  Monitor CMP to monitor hyponatremia.  Check CBC as well.  Refilled patient's albuterol for wheezing.  Use Tessalon Perles 200 mg every 8 hours as needed for cough.  Continue physical therapy.  Reassess in 1 week

## 2022-08-05 LAB — COMPLETE METABOLIC PANEL WITH GFR
AG Ratio: 2 (calc) (ref 1.0–2.5)
ALT: 11 U/L (ref 6–29)
AST: 12 U/L (ref 10–35)
Albumin: 4 g/dL (ref 3.6–5.1)
Alkaline phosphatase (APISO): 61 U/L (ref 37–153)
BUN: 14 mg/dL (ref 7–25)
CO2: 27 mmol/L (ref 20–32)
Calcium: 9.5 mg/dL (ref 8.6–10.4)
Chloride: 99 mmol/L (ref 98–110)
Creat: 0.93 mg/dL (ref 0.60–0.95)
Globulin: 2 g/dL (calc) (ref 1.9–3.7)
Glucose, Bld: 92 mg/dL (ref 65–99)
Potassium: 4.4 mmol/L (ref 3.5–5.3)
Sodium: 131 mmol/L — ABNORMAL LOW (ref 135–146)
Total Bilirubin: 0.6 mg/dL (ref 0.2–1.2)
Total Protein: 6 g/dL — ABNORMAL LOW (ref 6.1–8.1)
eGFR: 62 mL/min/{1.73_m2} (ref >=60)

## 2022-08-05 LAB — CBC WITH DIFFERENTIAL/PLATELET
Absolute Monocytes: 500 cells/uL (ref 200–950)
Basophils Absolute: 19 cells/uL (ref 0–200)
Basophils Relative: 0.5 %
Eosinophils Absolute: 81 cells/uL (ref 15–500)
Eosinophils Relative: 2.2 %
HCT: 31.6 % — ABNORMAL LOW (ref 35.0–45.0)
Hemoglobin: 10.9 g/dL — ABNORMAL LOW (ref 11.7–15.5)
Lymphs Abs: 951 cells/uL (ref 850–3900)
MCH: 34.6 pg — ABNORMAL HIGH (ref 27.0–33.0)
MCHC: 34.5 g/dL (ref 32.0–36.0)
MCV: 100.3 fL — ABNORMAL HIGH (ref 80.0–100.0)
MPV: 9.2 fL (ref 7.5–12.5)
Monocytes Relative: 13.5 %
Neutro Abs: 2150 cells/uL (ref 1500–7800)
Neutrophils Relative %: 58.1 %
Platelets: 239 10*3/uL (ref 140–400)
RBC: 3.15 10*6/uL — ABNORMAL LOW (ref 3.80–5.10)
RDW: 13.1 % (ref 11.0–15.0)
Total Lymphocyte: 25.7 %
WBC: 3.7 10*3/uL — ABNORMAL LOW (ref 3.8–10.8)

## 2022-08-15 DIAGNOSIS — H409 Unspecified glaucoma: Secondary | ICD-10-CM | POA: Diagnosis not present

## 2022-08-15 DIAGNOSIS — I440 Atrioventricular block, first degree: Secondary | ICD-10-CM | POA: Diagnosis not present

## 2022-08-15 DIAGNOSIS — E871 Hypo-osmolality and hyponatremia: Secondary | ICD-10-CM | POA: Diagnosis not present

## 2022-08-15 DIAGNOSIS — N1831 Chronic kidney disease, stage 3a: Secondary | ICD-10-CM | POA: Diagnosis not present

## 2022-08-15 DIAGNOSIS — U071 COVID-19: Secondary | ICD-10-CM | POA: Diagnosis not present

## 2022-08-15 DIAGNOSIS — R001 Bradycardia, unspecified: Secondary | ICD-10-CM | POA: Diagnosis not present

## 2022-08-15 DIAGNOSIS — I129 Hypertensive chronic kidney disease with stage 1 through stage 4 chronic kidney disease, or unspecified chronic kidney disease: Secondary | ICD-10-CM | POA: Diagnosis not present

## 2022-08-17 ENCOUNTER — Other Ambulatory Visit: Payer: Self-pay | Admitting: Family Medicine

## 2022-08-17 NOTE — Telephone Encounter (Signed)
Requested medication (s) are due for refill today:yes  Requested medication (s) are on the active medication list: yes  Last refill:  08/01/22  Future visit scheduled: no  Notes to clinic:  Medication not assigned to a protocol, review manually.      Requested Prescriptions  Pending Prescriptions Disp Refills   benzonatate (TESSALON) 100 MG capsule [Pharmacy Med Name: BENZONATATE 100 MG CAP] 30 capsule 0    Sig: TAKE 2 CAPSULES BY MOUTH 3 TIMES DAILY AS NEEDED FOR COUGH     Ear, Nose, and Throat:  Antitussives/Expectorants Failed - 08/17/2022 11:52 AM      Failed - Valid encounter within last 12 months    Recent Outpatient Visits           1 year ago General medical exam   [place] Susy Frizzle, MD   2 years ago Postmenopausal estrogen deficiency   [place] Susy Frizzle, MD   4 years ago Acute bacterial rhinosinusitis   [place] [facility] Denn[facility] Schaumann, Cammie Mcgee, MD   4 years ago Needs flu shot   [place] Pickard, Cammie Mcgee, MD   5 years ago Hospital discharge follow-up   [place] Dennard Schaumann, Cammie Mcgee, MD               HYDROcodone bit-homatropine (HYCODAN) 5-1.5 MG/5ML syrup [Pharmacy Med Name: HYDROCODONE BIT-HOMATROP MBR 5-1.5] 120 mL     Sig: TAKE 5 MLS BY MOUTH EVERY 8 HOURS AS NEEDED FOR COUGH.     Off-Protocol Failed - 08/17/2022 11:52 AM      Failed - Medication not assigned to a protocol, review manually.      Failed - Valid encounter within last 12 months    Recent Outpatient Visits           1 year ago General medical exam   [place] Susy Frizzle, MD   2 years ago Postmenopausal estrogen deficiency   [place] Dennard Schaumann, Cammie Mcgee, MD   4 years ago Acute bacterial rhinosinusitis   [place] Dennard Schaumann, Cammie Mcgee, MD   4 years ago Needs flu shot   [place], Cammie Mcgee, MD   5 years  ago Hospital discharge follow-up   [place] Susy Frizzle, MD

## 2022-08-24 DIAGNOSIS — D1801 Hemangioma of skin and subcutaneous tissue: Secondary | ICD-10-CM | POA: Diagnosis not present

## 2022-08-24 DIAGNOSIS — L821 Other seborrheic keratosis: Secondary | ICD-10-CM | POA: Diagnosis not present

## 2022-08-24 DIAGNOSIS — I8391 Asymptomatic varicose veins of right lower extremity: Secondary | ICD-10-CM | POA: Diagnosis not present

## 2022-08-24 DIAGNOSIS — L814 Other melanin hyperpigmentation: Secondary | ICD-10-CM | POA: Diagnosis not present

## 2022-08-24 DIAGNOSIS — L570 Actinic keratosis: Secondary | ICD-10-CM | POA: Diagnosis not present

## 2022-10-19 ENCOUNTER — Inpatient Hospital Stay: Admission: RE | Admit: 2022-10-19 | Payer: Medicare HMO | Source: Ambulatory Visit

## 2022-12-20 ENCOUNTER — Other Ambulatory Visit: Payer: Self-pay | Admitting: Family Medicine

## 2023-01-10 DIAGNOSIS — M5416 Radiculopathy, lumbar region: Secondary | ICD-10-CM | POA: Diagnosis not present

## 2023-01-10 DIAGNOSIS — M545 Low back pain, unspecified: Secondary | ICD-10-CM | POA: Diagnosis not present

## 2023-01-25 DIAGNOSIS — M545 Low back pain, unspecified: Secondary | ICD-10-CM | POA: Insufficient documentation

## 2023-01-25 DIAGNOSIS — M5451 Vertebrogenic low back pain: Secondary | ICD-10-CM | POA: Diagnosis not present

## 2023-01-25 DIAGNOSIS — M179 Osteoarthritis of knee, unspecified: Secondary | ICD-10-CM | POA: Insufficient documentation

## 2023-01-25 DIAGNOSIS — M4316 Spondylolisthesis, lumbar region: Secondary | ICD-10-CM | POA: Insufficient documentation

## 2023-01-25 DIAGNOSIS — M5416 Radiculopathy, lumbar region: Secondary | ICD-10-CM | POA: Diagnosis not present

## 2023-01-25 DIAGNOSIS — M5136 Other intervertebral disc degeneration, lumbar region: Secondary | ICD-10-CM | POA: Diagnosis not present

## 2023-01-25 DIAGNOSIS — M170 Bilateral primary osteoarthritis of knee: Secondary | ICD-10-CM | POA: Diagnosis not present

## 2023-01-25 DIAGNOSIS — M51369 Other intervertebral disc degeneration, lumbar region without mention of lumbar back pain or lower extremity pain: Secondary | ICD-10-CM | POA: Insufficient documentation

## 2023-02-05 ENCOUNTER — Other Ambulatory Visit: Payer: Self-pay | Admitting: Family Medicine

## 2023-02-06 NOTE — Telephone Encounter (Signed)
Requested medication (s) are due for refill today: yes  Requested medication (s) are on the active medication list: yes  Last refill:  07/18/22  Future visit scheduled: no  Notes to clinic:  Unable to refill per protocol, cannot delegate.      Requested Prescriptions  Pending Prescriptions Disp Refills   ALPRAZolam (XANAX) 0.5 MG tablet [Pharmacy Med Name: ALPRAZOLAM 0.5MG  TABLET] 30 tablet 2    Sig: TAKE ONE TABLET BY MOUTH AT BEDTIME AS NEEDED FOR ANXIETY OR SLEEP     Not Delegated - Psychiatry: Anxiolytics/Hypnotics 2 Failed - 02/05/2023 11:32 AM      Failed - This refill cannot be delegated      Failed - Urine Drug Screen completed in last 360 days      Failed - Valid encounter within last 6 months    Recent Outpatient Visits           1 year ago General medical exam   [facility] Donita Brooks, MD   3 years ago Postmenopausal estrogen deficiency   [facility] Donita Brooks, MD   5 years ago Acute bacterial rhinosinusitis   [facility] Donita Brooks, MD   5 years ago Needs flu shot   [facility] Pickard, Priscille Heidelberg, MD   6 years ago Hospital discharge follow-up   [facility] Pickard, Priscille Heidelberg, MD              Passed - Patient is not pregnant

## 2023-02-07 DIAGNOSIS — M5416 Radiculopathy, lumbar region: Secondary | ICD-10-CM | POA: Diagnosis not present

## 2023-03-01 DIAGNOSIS — M5416 Radiculopathy, lumbar region: Secondary | ICD-10-CM | POA: Diagnosis not present

## 2023-03-01 DIAGNOSIS — M4316 Spondylolisthesis, lumbar region: Secondary | ICD-10-CM | POA: Diagnosis not present

## 2023-03-01 DIAGNOSIS — B0229 Other postherpetic nervous system involvement: Secondary | ICD-10-CM | POA: Insufficient documentation

## 2023-03-01 DIAGNOSIS — M179 Osteoarthritis of knee, unspecified: Secondary | ICD-10-CM | POA: Diagnosis not present

## 2023-03-01 DIAGNOSIS — M5451 Vertebrogenic low back pain: Secondary | ICD-10-CM | POA: Diagnosis not present

## 2023-03-01 DIAGNOSIS — M5136 Other intervertebral disc degeneration, lumbar region: Secondary | ICD-10-CM | POA: Diagnosis not present

## 2023-03-01 DIAGNOSIS — M545 Low back pain, unspecified: Secondary | ICD-10-CM | POA: Diagnosis not present

## 2023-03-07 ENCOUNTER — Ambulatory Visit: Payer: Medicare HMO | Admitting: Family Medicine

## 2023-03-07 ENCOUNTER — Encounter: Payer: Self-pay | Admitting: Family Medicine

## 2023-03-07 ENCOUNTER — Ambulatory Visit (INDEPENDENT_AMBULATORY_CARE_PROVIDER_SITE_OTHER): Payer: Medicare HMO | Admitting: Family Medicine

## 2023-03-07 VITALS — BP 172/80 | HR 89 | Temp 98.7°F | Ht 69.0 in | Wt 186.0 lb

## 2023-03-07 DIAGNOSIS — I10 Essential (primary) hypertension: Secondary | ICD-10-CM

## 2023-03-07 MED ORDER — AMLODIPINE BESYLATE 5 MG PO TABS: 5.0000 mg | ORAL_TABLET | Freq: Every day | ORAL | 3 refills | Status: DC

## 2023-03-07 NOTE — Progress Notes (Signed)
Subjective:    Patient ID: Yvonne Rich, female    DOB: 03-Aug-1941, 82 y.o.   MRN: [id number]  Patient stopped taking her metoprolol last fall due to bradycardia.  She is on losartan 100 mg a day and doxazosin 2 mg a day.  Recently her blood pressure has been elevated with systolic blood pressure in the 170 range.  She denies any chest pain or shortness of breath.  She did recently injure her right shoulder.  She has bruising to the right bicep however most pain associated with the contents of Hawkins maneuver.  She is having pain with abducting her shoulder greater than 90 degrees.  I think she may have suffered a tear of her supraspinatus.  Her bicep strength is excellent showing no deficit in elbow flexion.  She prefers not to work this up at the present time because the pain is getting better Past Medical History:  Diagnosis Date   Allergy    Asthma    CKD (chronic kidney disease) stage 3, GFR 30-59 ml/min (HCC)    Glaucoma    Hypertension    Poliomyelitis    Sciatica of right side    Past Surgical History:  Procedure Laterality Date   CHOLECYSTECTOMY  2002   COSMETIC SURGERY  2012   eye lids   ROTATOR CUFF REPAIR  1994   Current Outpatient Medications on File Prior to Visit  Medication Sig Dispense Refill   acetaminophen (TYLENOL) 325 MG tablet Take 2 tablets (650 mg total) by mouth every 6 (six) hours as needed for mild pain (or Fever >/= 101).     albuterol (PROAIR HFA) 108 (90 Base) MCG/ACT inhaler Inhale 2 puffs into the lungs every 6 (six) hours as needed for wheezing or shortness of breath. 1 each 1   ALPRAZolam (XANAX) 0.5 MG tablet TAKE ONE TABLET BY MOUTH AT BEDTIME AS NEEDED FOR ANXIETY OR SLEEP 30 tablet 2   aspirin 81 MG tablet Take 81 mg by mouth daily.     benzonatate (TESSALON) 100 MG capsule TAKE 2 CAPSULES BY MOUTH 3 TIMES DAILY AS NEEDED FOR COUGH 30 capsule 0   cyanocobalamin (VITAMIN B12) 1000 MCG tablet Take 1,000 mcg by mouth daily.     dimenhyDRINATE  (DRAMAMINE) 50 MG tablet Take 50 mg by mouth every 8 (eight) hours as needed for nausea.     doxazosin (CARDURA) 2 MG tablet Take 1 tablet (2 mg total) by mouth daily. Stop amlodipine 30 tablet 0   guaiFENesin (MUCINEX) 600 MG 12 hr tablet Take 600 mg by mouth 2 (two) times daily as needed for cough.     HYDROcodone bit-homatropine (HYCODAN) 5-1.5 MG/5ML syrup TAKE 5 MLS BY MOUTH EVERY 8 HOURS AS NEEDED FOR COUGH. 120 mL 0   loratadine (CLARITIN) 10 MG tablet Take 10 mg by mouth daily.     losartan (COZAAR) 100 MG tablet TAKE ONE TABLET BY MOUTH ONCE DAILY 90 tablet 1   metoprolol tartrate (LOPRESSOR) 25 MG tablet Take 0.5 tablets (12.5 mg total) by mouth 2 (two) times daily. 30 tablet 0   oxymetazoline (AFRIN) 0.05 % nasal spray Place 1 spray into both nostrils 2 (two) times daily as needed for congestion.     Throat Lozenges (COUGH DROPS MT) Use as directed 1 lozenge in the mouth or throat 3 (three) times daily as needed (cough).     Vitamin D, Cholecalciferol, 1000 units CAPS Take 2,000 Units by mouth daily.      No current  facility-administered medications on file prior to visit.   Allergies  Allergen Reactions   Latex Rash   Penicillins Other (See Comments)    Breaking out around mouth   Erythromycin Rash   Sulfonamide Derivatives Rash   Social History   Socioeconomic History   Marital status: Married    Spouse name: Not on file   Number of children: Not on file   Years of education: Not on file   Highest education level: Not on file  Occupational History   Occupation: retired  Tobacco Use   Smoking status: Never   Smokeless tobacco: Never  Substance and Sexual Activity   Alcohol use: No   Drug use: No   Sexual activity: Yes  Other Topics Concern   Not on file  Social History Narrative   Not on file   Social Determinants of Health   Financial Resource Strain: Not on file  Food Insecurity: No Food Insecurity (07/25/2022)   Hunger Vital Sign    Worried About Running  Out of Food in the Last Year: Never true    Ran Out of Food in the Last Year: Never true  Transportation Needs: No Transportation Needs (07/25/2022)   PRAPARE - Administrator, Civil Service (Medical): No    Lack of Transportation (Non-Medical): No  Physical Activity: Not on file  Stress: Not on file  Social Connections: Not on file  Intimate Partner Violence: Not At Risk (07/25/2022)   Humiliation, Afraid, Rape, and Kick questionnaire    Fear of Current or Ex-Partner: No    Emotionally Abused: No    Physically Abused: No    Sexually Abused: No   Family History  Problem Relation Age of Onset   Hypertension Father    Vision loss Father       Review of Systems     Objective:   Physical Exam Constitutional:      General: She is not in acute distress.    Appearance: Normal appearance. She is normal weight. She is not ill-appearing, toxic-appearing or diaphoretic.  HENT:     Head: Normocephalic and atraumatic.  Neck:     Vascular: No carotid bruit.  Cardiovascular:     Rate and Rhythm: Regular rhythm. Bradycardia present.     Pulses: Normal pulses.     Heart sounds: Normal heart sounds. No murmur heard.    No friction rub. No gallop.  Pulmonary:     Effort: Pulmonary effort is normal. No respiratory distress.     Breath sounds: No stridor. No wheezing, rhonchi or rales.  Musculoskeletal:     Cervical back: Neck supple. No rigidity or tenderness.  Lymphadenopathy:     Cervical: No cervical adenopathy.  Neurological:     General: No focal deficit present.     Mental Status: She is alert and oriented to person, place, and time. Mental status is at baseline.     Cranial Nerves: No cranial nerve deficit.     Sensory: No sensory deficit.     Motor: No weakness.     Coordination: Coordination normal.     Gait: Gait normal.     Deep Tendon Reflexes: Reflexes normal.          Assessment & Plan:  Benign essential HTN Add amlodipine 5 mg a day and losartan  100 mg a day and doxazosin 2 mg a day and recheck blood pressure in 1 week

## 2023-05-01 ENCOUNTER — Other Ambulatory Visit: Payer: Self-pay | Admitting: Family Medicine

## 2023-05-30 NOTE — Patient Instructions (Incomplete)
Yvonne Rich , Thank you for taking time to come for your Medicare Wellness Visit. I appreciate your ongoing commitment to your health goals. Please review the following plan we discussed and let me know if I can assist you in the future.   Referrals/Orders/Follow-Ups/Clinician Recommendations: Aim for 30 minutes of exercise or brisk walking, 6-8 glasses of water, and 5 servings of fruits and vegetables each day.  You have an order for:  []   2D Mammogram  [x]   3D Mammogram  [x]   Bone Density     Please call for appointment:   [facility]  [address] [place] [address] [phone]   Make sure to wear two-piece clothing.  No lotions, powders, or deodorants the day of the appointment. Make sure to bring picture ID and insurance card.  Bring list of medications you are currently taking including any supplements.   Schedule your [place] screening mammogram through MyChart!   Log into your MyChart account.  Go to 'Visit' (or 'Appointments' if on mobile App) --> Schedule an Appointment  Under 'Select a Reason for Visit' choose the Mammogram Screening option.  Complete the pre-visit questions and select the time and place that best fits your schedule.    This is a list of the screening recommended for you and due dates:  Health Maintenance  Topic Date Due   Zoster (Shingles) Vaccine (1 of 2) Never done   DEXA scan (bone density measurement)  Never done   COVID-19 Vaccine (3 - Moderna risk series) 01/30/2020   Medicare Annual Wellness Visit  05/05/2023   Flu Shot  01/07/2024*   DTaP/Tdap/Td vaccine (3 - Td or Tdap) 10/13/2026   Pneumonia Vaccine  Completed   HPV Vaccine  Aged Out  *Topic was postponed. The date shown is not the original due date.    Advanced directives: (Copy Requested) Please bring a copy of your health care power of attorney and living will to the office to be added to your chart at your  convenience.  Next Medicare Annual Wellness Visit scheduled for next year: Yes

## 2023-05-31 ENCOUNTER — Ambulatory Visit (INDEPENDENT_AMBULATORY_CARE_PROVIDER_SITE_OTHER): Payer: Medicare HMO

## 2023-05-31 VITALS — BP 132/74 | Ht 69.0 in | Wt 185.0 lb

## 2023-05-31 DIAGNOSIS — Z0000 Encounter for general adult medical examination without abnormal findings: Secondary | ICD-10-CM | POA: Diagnosis not present

## 2023-05-31 DIAGNOSIS — Z1231 Encounter for screening mammogram for malignant neoplasm of breast: Secondary | ICD-10-CM

## 2023-05-31 DIAGNOSIS — Z780 Asymptomatic menopausal state: Secondary | ICD-10-CM | POA: Diagnosis not present

## 2023-05-31 NOTE — Progress Notes (Addendum)
Subjective:   Yvonne Rich is a 82 y.o. female who presents for Medicare Annual (Subsequent) preventive examination.  Visit Complete: In person  Review of Systems     Cardiac Risk Factors include: advanced age (>53men, >57 women)     Objective:    Today's Vitals   05/31/23 1139  BP: 132/74  Weight: 185 lb (83.9 kg)  Height: 5\' 9"  (1.753 m)   Body mass index is 27.32 kg/m.     05/31/2023   11:52 AM 07/25/2022    6:48 PM 07/24/2022   11:14 AM 04/29/2021    3:39 PM 10/14/2016    4:00 AM  Advanced Directives  Does Patient Have a Medical Advance Directive? Yes  No No No  Type of Estate agent of [place];Living will      Copy of Healthcare Power of Attorney in Chart? No - copy requested      Would patient like information on creating a medical advance directive?  No - Patient declined  No - Patient declined No - Patient declined    Current Medications (verified) Outpatient Encounter Medications as of 05/31/2023  Medication Sig   acetaminophen (TYLENOL) 325 MG tablet Take 2 tablets (650 mg total) by mouth every 6 (six) hours as needed for mild pain (or Fever >/= 101).   albuterol (PROAIR HFA) 108 (90 Base) MCG/ACT inhaler Inhale 2 puffs into the lungs every 6 (six) hours as needed for wheezing or shortness of breath.   ALPRAZolam (XANAX) 0.5 MG tablet TAKE ONE TABLET BY MOUTH AT BEDTIME AS NEEDED FOR ANXIETY OR SLEEP   amLODipine (NORVASC) 5 MG tablet Take 1 tablet (5 mg total) by mouth daily.   aspirin 81 MG tablet Take 81 mg by mouth daily.   benzonatate (TESSALON) 100 MG capsule TAKE 2 CAPSULES BY MOUTH 3 TIMES DAILY AS NEEDED FOR COUGH   cyanocobalamin (VITAMIN B12) 1000 MCG tablet Take 1,000 mcg by mouth daily.   dimenhyDRINATE (DRAMAMINE) 50 MG tablet Take 50 mg by mouth every 8 (eight) hours as needed for nausea.   guaiFENesin (MUCINEX) 600 MG 12 hr tablet Take 600 mg by mouth 2 (two) times daily as needed for cough.   HYDROcodone  bit-homatropine (HYCODAN) 5-1.5 MG/5ML syrup TAKE 5 MLS BY MOUTH EVERY 8 HOURS AS NEEDED FOR COUGH.   loratadine (CLARITIN) 10 MG tablet Take 10 mg by mouth daily.   losartan (COZAAR) 100 MG tablet TAKE ONE TABLET BY MOUTH ONCE DAILY   oxymetazoline (AFRIN) 0.05 % nasal spray Place 1 spray into both nostrils 2 (two) times daily as needed for congestion.   Throat Lozenges (COUGH DROPS MT) Use as directed 1 lozenge in the mouth or throat 3 (three) times daily as needed (cough).   Vitamin D, Cholecalciferol, 1000 units CAPS Take 2,000 Units by mouth daily.    doxazosin (CARDURA) 2 MG tablet Take 1 tablet (2 mg total) by mouth daily. Stop amlodipine   No facility-administered encounter medications on file as of 05/31/2023.    Allergies (verified) Latex, Penicillins, Erythromycin, and Sulfonamide derivatives   History: Past Medical History:  Diagnosis Date   Allergy    Asthma    CKD (chronic kidney disease) stage 3, GFR 30-59 ml/min (HCC)    Glaucoma    Hypertension    Poliomyelitis    Sciatica of right side    Past Surgical History:  Procedure Laterality Date   CHOLECYSTECTOMY  2002   COSMETIC SURGERY  2012   eye lids  ROTATOR CUFF REPAIR  1994   Family History  Problem Relation Age of Onset   Hypertension Father    Vision loss Father    Social History   Socioeconomic History   Marital status: Married    Spouse name: Not on file   Number of children: Not on file   Years of education: Not on file   Highest education level: Not on file  Occupational History   Occupation: retired  Tobacco Use   Smoking status: Never   Smokeless tobacco: Never  Substance and Sexual Activity   Alcohol use: No   Drug use: No   Sexual activity: Yes  Other Topics Concern   Not on file  Social History Narrative   Not on file   Social Determinants of Health   Financial Resource Strain: Low Risk  (05/31/2023)   Overall Financial Resource Strain (CARDIA)    Difficulty of Paying Living  Expenses: Not hard at all  Food Insecurity: No Food Insecurity (05/31/2023)   Hunger Vital Sign    Worried About Running Out of Food in the Last Year: Never true    Ran Out of Food in the Last Year: Never true  Transportation Needs: No Transportation Needs (05/31/2023)   PRAPARE - Administrator, Civil Service (Medical): No    Lack of Transportation (Non-Medical): No  Physical Activity: Insufficiently Active (05/31/2023)   Exercise Vital Sign    Days of Exercise per Week: 3 days    Minutes of Exercise per Session: 30 min  Stress: No Stress Concern Present (05/31/2023)   [employer] of Occupational Health - Occupational Stress Questionnaire    Feeling of Stress : Not at all  Social Connections: Moderately Integrated (05/31/2023)   Social Connection and Isolation Panel [NHANES]    Frequency of Communication with Friends and Family: More than three times a week    Frequency of Social Gatherings with Friends and Family: Three times a week    Attends Religious Services: 1 to 4 times per year    Active Member of Clubs or Organizations: No    Attends Banker Meetings: Never    Marital Status: Married    Tobacco Counseling Counseling given: Not Answered   Clinical Intake:  Pre-visit preparation completed: Yes  Pain : No/denies pain     Diabetes: No  How often do you need to have someone help you when you read instructions, pamphlets, or other written materials from your doctor or pharmacy?: 1 - Never  Interpreter Needed?: No  Information entered by :: Kandis Fantasia LPN   Activities of Daily Living    05/31/2023   11:39 AM 07/25/2022    6:45 PM  In your present state of health, do you have any difficulty performing the following activities:  Hearing? 0   Vision? 0   Difficulty concentrating or making decisions? 0   Walking or climbing stairs? 0   Dressing or bathing? 0   Doing errands, shopping? 0 0  Preparing Food and eating ? N   Using the  Toilet? N   In the past six months, have you accidently leaked urine? N   Do you have problems with loss of bowel control? N   Managing your Medications? N   Managing your Finances? N   Housekeeping or managing your Housekeeping? N     Patient Care Team: Donita Brooks, MD as PCP - General (Family Medicine) Tanya Nones Priscille Heidelberg, MD (Family Medicine) [facility] [place]  C, MD as Referring Physician (Dermatology)  Indicate any recent Medical Services you may have received from other than [facility] providers in the past year (date may be approximate).     Assessment:   This is a routine wellness examination for Yvonne Rich.  Hearing/Vision screen Hearing Screening - Comments:: Wears bilateral hearing aids  Vision Screening - Comments:: Wears rx glasses - up to date with routine eye exams with [facility]   Dietary issues and exercise activities discussed:     Goals Addressed             This Visit's Progress    Remain active and independent        Depression Screen    05/31/2023   11:31 AM 08/04/2022   12:16 PM 04/29/2021    3:37 PM 01/26/2020   10:35 AM 12/28/2017    3:36 PM 11/16/2017    4:37 PM 11/16/2017    2:59 PM  PHQ 2/9 Scores  PHQ - 2 Score 0 1 0 0 0 0 0    Fall Risk    05/31/2023   11:38 AM 04/29/2021    3:37 PM 01/26/2020   10:35 AM 12/28/2017    3:36 PM 11/16/2017    4:37 PM  Fall Risk   Falls in the past year? 0 0 0 No No  Number falls in past yr: 0 0     Injury with Fall? 0 0     Risk for fall due to : No Fall Risks No Fall Risks     Follow up Falls prevention discussed;Education provided;Falls evaluation completed Falls evaluation completed Falls evaluation completed      MEDICARE RISK AT HOME: Medicare Risk at Home Any stairs in or around the home?: No If so, are there any without handrails?: No Home free of loose throw rugs in walkways, pet beds, electrical cords, etc?: Yes Adequate lighting in your home to reduce risk of  falls?: Yes Life alert?: No Use of a cane, walker or w/c?: No Grab bars in the bathroom?: Yes Shower chair or bench in shower?: Yes Elevated toilet seat or a handicapped toilet?: Yes  TIMED UP AND GO:  Was the test performed?  Yes  Length of time to ambulate 10 feet: 9 sec Gait slow and steady without use of assistive device    Cognitive Function:        05/31/2023   11:52 AM  6CIT Screen  What Year? 0 points  What month? 0 points  What time? 0 points  Count back from 20 0 points  Months in reverse 0 points  Repeat phrase 0 points  Total Score 0 points    Immunizations Immunization History  Administered Date(s) Administered   Influenza, High Dose Seasonal PF 09/07/2016, 10/21/2018   Influenza,inj,Quad PF,6+ Mos 11/16/2017   Influenza-Unspecified 07/05/2012, 08/10/2015   Moderna Sars-Covid-2 Vaccination 12/05/2019, 01/02/2020   Pneumococcal Conjugate-13 12/28/2017   Pneumococcal Polysaccharide-23 10/09/2010, 05/14/2012   Td 10/16/2000   Tdap 10/13/2016    TDAP status: Up to date  Flu Vaccine status: Due, Education has been provided regarding the importance of this vaccine. Advised may receive this vaccine at local pharmacy or Health Dept. Aware to provide a copy of the vaccination record if obtained from local pharmacy or Health Dept. Verbalized acceptance and understanding.  Pneumococcal vaccine status: Up to date  Covid-19 vaccine status: Information provided on how to obtain vaccines.   Qualifies for Shingles Vaccine? Yes   Zostavax completed No   Shingrix  Completed?: No.    Education has been provided regarding the importance of this vaccine. Patient has been advised to call insurance company to determine out of pocket expense if they have not yet received this vaccine. Advised may also receive vaccine at local pharmacy or Health Dept. Verbalized acceptance and understanding.  Screening Tests Health Maintenance  Topic Date Due   Zoster Vaccines- Shingrix (1  of 2) Never done   DEXA SCAN  Never done   COVID-19 Vaccine (3 - Moderna risk series) 01/30/2020   INFLUENZA VACCINE  01/07/2024 (Originally 05/10/2023)   Medicare Annual Wellness (AWV)  05/30/2024   DTaP/Tdap/Td (3 - Td or Tdap) 10/13/2026   Pneumonia Vaccine 24+ Years old  Completed   HPV VACCINES  Aged Out    Health Maintenance  Health Maintenance Due  Topic Date Due   Zoster Vaccines- Shingrix (1 of 2) Never done   DEXA SCAN  Never done   COVID-19 Vaccine (3 - Moderna risk series) 01/30/2020    Colorectal cancer screening: No longer required.   Mammogram status: Ordered today. Pt provided with contact info and advised to call to schedule appt.   Bone Density status: Ordered today. Pt provided with contact info and advised to call to schedule appt.  Lung Cancer Screening: (Low Dose CT Chest recommended if Age 40-80 years, 20 pack-year currently smoking OR have quit w/in 15years.) does not qualify.   Lung Cancer Screening Referral: n/a  Additional Screening:  Hepatitis C Screening: does not qualify  Vision Screening: Recommended annual ophthalmology exams for early detection of glaucoma and other disorders of the eye. Is the patient up to date with their annual eye exam?  Yes  Who is the provider or what is the name of the office in which the patient attends annual eye exams? [facility] If pt is not established with a provider, would they like to be referred to a provider to establish care? No .   Dental Screening: Recommended annual dental exams for proper oral hygiene  Community Resource Referral / Chronic Care Management: CRR required this visit?  No   CCM required this visit?  No     Plan:     I have personally reviewed and noted the following in the patient's chart:   Medical and social history Use of alcohol, tobacco or illicit drugs  Current medications and supplements including opioid prescriptions. Patient is not currently taking opioid  prescriptions. Functional ability and status Nutritional status Physical activity Advanced directives List of other physicians Hospitalizations, surgeries, and ER visits in previous 12 months Vitals Screenings to include cognitive, depression, and falls Referrals and appointments  In addition, I have reviewed and discussed with patient certain preventive protocols, quality metrics, and best practice recommendations. A written personalized care plan for preventive services as well as general preventive health recommendations were provided to patient.     Kandis Fantasia [place], [place]   0/63/[id number]   Nurse Notes: No concerns at this time

## 2023-06-04 DIAGNOSIS — Z9842 Cataract extraction status, left eye: Secondary | ICD-10-CM | POA: Diagnosis not present

## 2023-06-04 DIAGNOSIS — H52213 Irregular astigmatism, bilateral: Secondary | ICD-10-CM | POA: Diagnosis not present

## 2023-06-04 DIAGNOSIS — Z9841 Cataract extraction status, right eye: Secondary | ICD-10-CM | POA: Diagnosis not present

## 2023-06-07 DIAGNOSIS — M4316 Spondylolisthesis, lumbar region: Secondary | ICD-10-CM | POA: Diagnosis not present

## 2023-06-07 DIAGNOSIS — M5451 Vertebrogenic low back pain: Secondary | ICD-10-CM | POA: Diagnosis not present

## 2023-06-07 DIAGNOSIS — M5416 Radiculopathy, lumbar region: Secondary | ICD-10-CM | POA: Diagnosis not present

## 2023-06-07 DIAGNOSIS — M5136 Other intervertebral disc degeneration, lumbar region: Secondary | ICD-10-CM | POA: Diagnosis not present

## 2023-06-07 DIAGNOSIS — M545 Low back pain, unspecified: Secondary | ICD-10-CM | POA: Diagnosis not present

## 2023-06-07 DIAGNOSIS — M179 Osteoarthritis of knee, unspecified: Secondary | ICD-10-CM | POA: Diagnosis not present

## 2023-06-07 DIAGNOSIS — B0229 Other postherpetic nervous system involvement: Secondary | ICD-10-CM | POA: Diagnosis not present

## 2023-06-25 ENCOUNTER — Other Ambulatory Visit: Payer: Self-pay | Admitting: Family Medicine

## 2023-06-26 NOTE — Telephone Encounter (Signed)
Requested medication (s) are due for refill today:   Not sure  There are 2 refill dates:  12/20/2022 #90, 1 refill;  Another rx was written on 03/07/2023 #90, 3 refills at her [facility].  Requested medication (s) are on the active medication list:   Yes  Future visit scheduled:   No    LOV 03/07/2023   Last ordered: See above  Returned because Cr and potassium due so unable to refill per protocol     Requested Prescriptions  Pending Prescriptions Disp Refills   losartan (COZAAR) 100 MG tablet [Pharmacy Med Name: LOSARTAN POTASSIUM 100MG  TABLET] 90 tablet 1    Sig: TAKE ONE TABLET BY MOUTH ONCE DAILY     Cardiovascular:  Angiotensin Receptor Blockers Failed - 06/25/2023 10:57 AM      Failed - Cr in normal range and within 180 days    Creat  Date Value Ref Range Status  08/04/2022 0.93 0.60 - 0.95 mg/dL Final   Creatinine, Urine  Date Value Ref Range Status  05/04/2022 87 20 - 275 mg/dL Final         Failed - K in normal range and within 180 days    Potassium  Date Value Ref Range Status  08/04/2022 4.4 3.5 - 5.3 mmol/L Final  08/21/2012 4.2 3.5 - 5.1 mmol/L Final         Failed - Valid encounter within last 6 months    Recent Outpatient Visits           2 years ago General medical exam   [facility] Donita Brooks, MD   3 years ago Postmenopausal estrogen deficiency   [facility] Donita Brooks, MD   5 years ago Acute bacterial rhinosinusitis   [facility] Donita Brooks, MD   5 years ago Needs flu shot   [facility] Pickard, Priscille Heidelberg, MD   6 years ago Hospital discharge follow-up   [facility] Medicine Donita Brooks, MD              Passed - Patient is not pregnant      Passed - Last BP in normal range    BP Readings from Last 1 Encounters:  05/31/23 132/74

## 2023-06-27 ENCOUNTER — Ambulatory Visit
Admission: RE | Admit: 2023-06-27 | Discharge: 2023-06-27 | Disposition: A | Discharge status: Home / Self Care | Payer: Medicare HMO | Source: Ambulatory Visit | Attending: Family Medicine | Admitting: Family Medicine

## 2023-06-27 DIAGNOSIS — M85851 Other specified disorders of bone density and structure, right thigh: Secondary | ICD-10-CM | POA: Diagnosis not present

## 2023-06-27 DIAGNOSIS — Z780 Asymptomatic menopausal state: Secondary | ICD-10-CM | POA: Insufficient documentation

## 2023-06-27 DIAGNOSIS — Z1231 Encounter for screening mammogram for malignant neoplasm of breast: Secondary | ICD-10-CM | POA: Diagnosis not present

## 2023-06-27 NOTE — Telephone Encounter (Signed)
Received call from Landon at pharmacy to follow up on refill requested for losartan (COZAAR) 100 MG tablet   **New script requested** LOV 03/07/2023 **Patient out of medication**  Pharmacy:   [facility] [place], [place] - [address], [place] [address] Phone: [phone]  Fax: [phone]   Please advise pharmacist.

## 2023-07-16 ENCOUNTER — Other Ambulatory Visit: Payer: Self-pay | Admitting: Family Medicine

## 2023-08-27 DIAGNOSIS — L570 Actinic keratosis: Secondary | ICD-10-CM | POA: Diagnosis not present

## 2023-08-27 DIAGNOSIS — C44329 Squamous cell carcinoma of skin of other parts of face: Secondary | ICD-10-CM | POA: Diagnosis not present

## 2023-08-27 DIAGNOSIS — L821 Other seborrheic keratosis: Secondary | ICD-10-CM | POA: Diagnosis not present

## 2023-08-27 DIAGNOSIS — D225 Melanocytic nevi of trunk: Secondary | ICD-10-CM | POA: Diagnosis not present

## 2023-08-27 DIAGNOSIS — L814 Other melanin hyperpigmentation: Secondary | ICD-10-CM | POA: Diagnosis not present

## 2023-08-27 DIAGNOSIS — D485 Neoplasm of uncertain behavior of skin: Secondary | ICD-10-CM | POA: Diagnosis not present

## 2023-09-03 ENCOUNTER — Other Ambulatory Visit: Payer: Self-pay | Admitting: Family Medicine

## 2023-09-04 NOTE — Telephone Encounter (Signed)
Requested medication (s) are due for refill today - yes  Requested medication (s) are on the active medication list -yes  Future visit scheduled -no  Last refill: 05/01/23 #30 2RF  Notes to clinic: non delegated Rx  Requested Prescriptions  Pending Prescriptions Disp Refills   ALPRAZolam (XANAX) 0.5 MG tablet [Pharmacy Med Name: ALPRAZOLAM 0.5MG  TABLET] 30 tablet 2    Sig: TAKE ONE TABLET BY MOUTH AT BEDTIME AS NEEDED FOR ANXIETY OR SLEEP     Not Delegated - Psychiatry: Anxiolytics/Hypnotics 2 Failed - 09/03/2023  3:42 PM      Failed - This refill cannot be delegated      Failed - Urine Drug Screen completed in last 360 days      Failed - Valid encounter within last 6 months    Recent Outpatient Visits           2 years ago General medical exam   [facility] Donita Brooks, MD   3 years ago Postmenopausal estrogen deficiency   [facility] Medicine Tanya Nones, Priscille Heidelberg, MD   5 years ago Acute bacterial rhinosinusitis   [facility] Tanya Nones, Priscille Heidelberg, MD   5 years ago Needs flu shot   [facility] Pickard, Priscille Heidelberg, MD   6 years ago Hospital discharge follow-up   [facility] Pickard, Priscille Heidelberg, MD              Passed - Patient is not pregnant         Requested Prescriptions  Pending Prescriptions Disp Refills   ALPRAZolam Prudy Feeler) 0.5 MG tablet [Pharmacy Med Name: ALPRAZOLAM 0.5MG  TABLET] 30 tablet 2    Sig: TAKE ONE TABLET BY MOUTH AT BEDTIME AS NEEDED FOR ANXIETY OR SLEEP     Not Delegated - Psychiatry: Anxiolytics/Hypnotics 2 Failed - 09/03/2023  3:42 PM      Failed - This refill cannot be delegated      Failed - Urine Drug Screen completed in last 360 days      Failed - Valid encounter within last 6 months    Recent Outpatient Visits           2 years ago General medical exam   [facility] Donita Brooks, MD   3 years ago Postmenopausal estrogen deficiency    [facility] Medicine Donita Brooks, MD   5 years ago Acute bacterial rhinosinusitis   [facility] Tanya Nones, Priscille Heidelberg, MD   5 years ago Needs flu shot   [facility] Pickard, Priscille Heidelberg, MD   6 years ago Hospital discharge follow-up   [facility] Pickard, Priscille Heidelberg, MD              Passed - Patient is not pregnant

## 2023-10-04 ENCOUNTER — Other Ambulatory Visit: Payer: Self-pay | Admitting: Family Medicine

## 2023-10-08 DIAGNOSIS — C44329 Squamous cell carcinoma of skin of other parts of face: Secondary | ICD-10-CM | POA: Diagnosis not present

## 2023-10-16 ENCOUNTER — Other Ambulatory Visit: Payer: Self-pay | Admitting: Family Medicine

## 2023-10-16 ENCOUNTER — Other Ambulatory Visit: Payer: Medicare HMO

## 2023-10-16 DIAGNOSIS — I10 Essential (primary) hypertension: Secondary | ICD-10-CM | POA: Diagnosis not present

## 2023-10-16 DIAGNOSIS — E669 Obesity, unspecified: Secondary | ICD-10-CM | POA: Diagnosis not present

## 2023-10-16 DIAGNOSIS — D492 Neoplasm of unspecified behavior of bone, soft tissue, and skin: Secondary | ICD-10-CM | POA: Diagnosis not present

## 2023-10-16 DIAGNOSIS — L570 Actinic keratosis: Secondary | ICD-10-CM

## 2023-10-17 LAB — CBC WITH DIFFERENTIAL/PLATELET
Absolute Lymphocytes: 1045 {cells}/uL (ref 850–3900)
Absolute Monocytes: 334 {cells}/uL (ref 200–950)
Basophils Absolute: 19 {cells}/uL (ref 0–200)
Basophils Relative: 0.5 %
Eosinophils Absolute: 42 {cells}/uL (ref 15–500)
Eosinophils Relative: 1.1 %
HCT: 37.5 % (ref 35.0–45.0)
Hemoglobin: 12.1 g/dL (ref 11.7–15.5)
MCH: 32.4 pg (ref 27.0–33.0)
MCHC: 32.3 g/dL (ref 32.0–36.0)
MCV: 100.5 fL — ABNORMAL HIGH (ref 80.0–100.0)
MPV: 9.2 fL (ref 7.5–12.5)
Monocytes Relative: 8.8 %
Neutro Abs: 2360 {cells}/uL (ref 1500–7800)
Neutrophils Relative %: 62.1 %
Platelets: 277 10*3/uL (ref 140–400)
RBC: 3.73 10*6/uL — ABNORMAL LOW (ref 3.80–5.10)
RDW: 13.3 % (ref 11.0–15.0)
Total Lymphocyte: 27.5 %
WBC: 3.8 10*3/uL (ref 3.8–10.8)

## 2023-10-17 LAB — PATHOLOGIST SMEAR REVIEW

## 2024-02-29 ENCOUNTER — Other Ambulatory Visit: Payer: Self-pay | Admitting: Family Medicine

## 2024-03-04 ENCOUNTER — Other Ambulatory Visit: Payer: Self-pay

## 2024-03-04 MED ORDER — LOSARTAN POTASSIUM 100 MG PO TABS: 100.0000 mg | ORAL_TABLET | Freq: Every day | ORAL | 2 refills | Status: DC

## 2024-03-04 NOTE — Telephone Encounter (Signed)
 Requested medications are due for refill today.  yes  Requested medications are on the active medications list.  yes  Last refill. 07/11/2023 #90 1 rf  Future visit scheduled.   yes  Notes to clinic.  Labs are expired.    Requested Prescriptions  Pending Prescriptions Disp Refills   losartan  (COZAAR ) 100 MG tablet [Pharmacy Med Name: LOSARTAN  POTASSIUM 100MG  TABLET] 90 tablet 1    Sig: TAKE ONE TABLET BY MOUTH ONCE DAILY     Cardiovascular:  Angiotensin Receptor Blockers Failed - 03/04/2024 11:50 AM      Failed - Cr in normal range and within 180 days    Creat  Date Value Ref Range Status  08/04/2022 0.93 0.60 - 0.95 mg/dL Final   Creatinine, Urine  Date Value Ref Range Status  05/04/2022 87 20 - 275 mg/dL Final         Failed - K in normal range and within 180 days    Potassium  Date Value Ref Range Status  08/04/2022 4.4 3.5 - 5.3 mmol/L Final  08/21/2012 4.2 3.5 - 5.1 mmol/L Final         Failed - Valid encounter within last 6 months    Recent Outpatient Visits           12 months ago Benign essential HTN   [facility] Austine Lefort, MD   1 year ago Hyponatremia   [facility] Austine Lefort, MD   1 year ago General medical exam   [facility] Austine Lefort, MD              Passed - Patient is not pregnant      Passed - Last BP in normal range    BP Readings from Last 1 Encounters:  05/31/23 132/74

## 2024-03-04 NOTE — Telephone Encounter (Signed)
 Medication sent to [facility] w/ 30 days w/ 2 refills. As pt is scheduled later this year.

## 2024-04-25 ENCOUNTER — Encounter: Payer: Self-pay | Admitting: Advanced Practice Midwife

## 2024-05-12 ENCOUNTER — Ambulatory Visit: Admitting: Family Medicine

## 2024-05-12 ENCOUNTER — Encounter: Payer: Self-pay | Admitting: Family Medicine

## 2024-05-12 VITALS — BP 138/70 | HR 73 | Temp 97.7°F | Ht 69.0 in | Wt 187.0 lb

## 2024-05-12 DIAGNOSIS — G8929 Other chronic pain: Secondary | ICD-10-CM | POA: Diagnosis not present

## 2024-05-12 DIAGNOSIS — G4700 Insomnia, unspecified: Secondary | ICD-10-CM | POA: Diagnosis not present

## 2024-05-12 DIAGNOSIS — I10 Essential (primary) hypertension: Secondary | ICD-10-CM

## 2024-05-12 DIAGNOSIS — Z1231 Encounter for screening mammogram for malignant neoplasm of breast: Secondary | ICD-10-CM

## 2024-05-12 DIAGNOSIS — M545 Low back pain, unspecified: Secondary | ICD-10-CM

## 2024-05-12 MED ORDER — ALPRAZOLAM 0.5 MG PO TABS: 0.5000 mg | ORAL_TABLET | Freq: Every evening | ORAL | 2 refills | Status: DC | PRN

## 2024-05-12 MED ORDER — CYCLOBENZAPRINE HCL 5 MG PO TABS: 5.0000 mg | ORAL_TABLET | Freq: Two times a day (BID) | ORAL | 0 refills | Status: DC

## 2024-05-12 NOTE — Progress Notes (Signed)
 Subjective:    Patient ID: Yvonne Rich, female    DOB: 28-Aug-1941, 83 y.o.   MRN: [id number]  Patient is here today requesting a refill of her Xanax  that she uses sparingly for anxiety and to help her sleep as well as a refill on her Flexeril  that she takes for her back.  She has a history of scoliosis, spinal stenosis, as well as degenerative disc disease and she uses Flexeril  sparingly to help with her low back pain.  She is overdue for shingles vaccine as well as an RSV vaccine and COVID and flu shot.  Patient refuses all vaccinations.  She is due for mammogram which I recommended.  Her blood pressure today is well-controlled. Past Medical History:  Diagnosis Date   Allergy    Asthma    CKD (chronic kidney disease) stage 3, GFR 30-59 ml/min (HCC)    Glaucoma    Hypertension    Poliomyelitis    Sciatica of right side    Past Surgical History:  Procedure Laterality Date   CHOLECYSTECTOMY  2002   COSMETIC SURGERY  2012   eye lids   ROTATOR CUFF REPAIR  1994   Current Outpatient Medications on File Prior to Visit  Medication Sig Dispense Refill   albuterol  (PROAIR  HFA) 108 (90 Base) MCG/ACT inhaler Inhale 2 puffs into the lungs every 6 (six) hours as needed for wheezing or shortness of breath. 1 each 1   doxazosin  (CARDURA ) 2 MG tablet TAKE ONE TABLET BY MOUTH ONCE DAILY. STOP AMLODIPINE ! 90 tablet 1   losartan  (COZAAR ) 100 MG tablet TAKE ONE TABLET BY MOUTH ONCE DAILY 90 tablet 1   acetaminophen  (TYLENOL ) 325 MG tablet Take 2 tablets (650 mg total) by mouth every 6 (six) hours as needed for mild pain (or Fever >/= 101). (Patient not taking: Reported on 05/12/2024)     amLODipine  (NORVASC ) 5 MG tablet TAKE ONE TABLET (5 MG TOTAL) BY MOUTH DAILY. (Patient not taking: Reported on 05/12/2024) 90 tablet 3   aspirin  81 MG tablet Take 81 mg by mouth daily. (Patient not taking: Reported on 05/12/2024)     benzonatate  (TESSALON ) 100 MG capsule TAKE 2 CAPSULES BY MOUTH 3 TIMES DAILY AS NEEDED  FOR COUGH (Patient not taking: Reported on 05/12/2024) 30 capsule 0   cyanocobalamin (VITAMIN B12) 1000 MCG tablet Take 1,000 mcg by mouth daily. (Patient not taking: Reported on 05/12/2024)     dimenhyDRINATE (DRAMAMINE) 50 MG tablet Take 50 mg by mouth every 8 (eight) hours as needed for nausea. (Patient not taking: Reported on 05/12/2024)     guaiFENesin  (MUCINEX ) 600 MG 12 hr tablet Take 600 mg by mouth 2 (two) times daily as needed for cough. (Patient not taking: Reported on 05/12/2024)     HYDROcodone  bit-homatropine (HYCODAN) 5-1.5 MG/5ML syrup TAKE 5 MLS BY MOUTH EVERY 8 HOURS AS NEEDED FOR COUGH. (Patient not taking: Reported on 05/12/2024) 120 mL 0   loratadine  (CLARITIN ) 10 MG tablet Take 10 mg by mouth daily. (Patient not taking: Reported on 05/12/2024)     oxymetazoline (AFRIN) 0.05 % nasal spray Place 1 spray into both nostrils 2 (two) times daily as needed for congestion. (Patient not taking: Reported on 05/12/2024)     Throat Lozenges (COUGH DROPS MT) Use as directed 1 lozenge in the mouth or throat 3 (three) times daily as needed (cough). (Patient not taking: Reported on 05/12/2024)     Vitamin D, Cholecalciferol, 1000 units CAPS Take 2,000 Units by mouth daily.  (  Patient not taking: Reported on 05/12/2024)     No current facility-administered medications on file prior to visit.   Allergies  Allergen Reactions   Latex Rash   Penicillins Other (See Comments)    Breaking out around mouth   Erythromycin Rash   Sulfonamide Derivatives Rash   Social History   Socioeconomic History   Marital status: Married    Spouse name: Not on file   Number of children: Not on file   Years of education: Not on file   Highest education level: Not on file  Occupational History   Occupation: retired  Tobacco Use   Smoking status: Never   Smokeless tobacco: Never  Substance and Sexual Activity   Alcohol use: No   Drug use: No   Sexual activity: Yes  Other Topics Concern   Not on file  Social History  Narrative   Not on file   Social Drivers of Health   Financial Resource Strain: Low Risk  (05/31/2023)   Overall Financial Resource Strain (CARDIA)    Difficulty of Paying Living Expenses: Not hard at all  Food Insecurity: No Food Insecurity (05/31/2023)   Hunger Vital Sign    Worried About Running Out of Food in the Last Year: Never true    Ran Out of Food in the Last Year: Never true  Transportation Needs: No Transportation Needs (05/31/2023)   PRAPARE - Administrator, Civil Service (Medical): No    Lack of Transportation (Non-Medical): No  Physical Activity: Insufficiently Active (05/31/2023)   Exercise Vital Sign    Days of Exercise per Week: 3 days    Minutes of Exercise per Session: 30 min  Stress: No Stress Concern Present (05/31/2023)   [employer] of Occupational Health - Occupational Stress Questionnaire    Feeling of Stress : Not at all  Social Connections: Moderately Integrated (05/31/2023)   Social Connection and Isolation Panel    Frequency of Communication with Friends and Family: More than three times a week    Frequency of Social Gatherings with Friends and Family: Three times a week    Attends Religious Services: 1 to 4 times per year    Active Member of Clubs or Organizations: No    Attends Banker Meetings: Never    Marital Status: Married  Catering manager Violence: Not At Risk (05/31/2023)   Humiliation, Afraid, Rape, and Kick questionnaire    Fear of Current or Ex-Partner: No    Emotionally Abused: No    Physically Abused: No    Sexually Abused: No   Family History  Problem Relation Age of Onset   Hypertension Father    Vision loss Father       Review of Systems     Objective:   Physical Exam Constitutional:      General: She is not in acute distress.    Appearance: Normal appearance. She is normal weight. She is not ill-appearing, toxic-appearing or diaphoretic.  HENT:     Head: Normocephalic and atraumatic.   Neck:     Vascular: No carotid bruit.  Cardiovascular:     Rate and Rhythm: Regular rhythm. Bradycardia present.     Pulses: Normal pulses.     Heart sounds: Normal heart sounds. No murmur heard.    No friction rub. No gallop.  Pulmonary:     Effort: Pulmonary effort is normal. No respiratory distress.     Breath sounds: No stridor. No wheezing, rhonchi or rales.  Musculoskeletal:  Cervical back: Neck supple. No rigidity or tenderness.  Lymphadenopathy:     Cervical: No cervical adenopathy.  Neurological:     General: No focal deficit present.     Mental Status: She is alert and oriented to person, place, and time. Mental status is at baseline.     Cranial Nerves: No cranial nerve deficit.     Sensory: No sensory deficit.     Motor: No weakness.     Coordination: Coordination normal.     Gait: Gait normal.     Deep Tendon Reflexes: Reflexes normal.           Assessment & Plan:  Insomnia, unspecified type - Plan: ALPRAZolam  (XANAX ) 0.5 MG tablet  Chronic low back pain, unspecified back pain laterality, unspecified whether sciatica present - Plan: cyclobenzaprine  (FLEXERIL ) 5 MG tablet  Encounter for screening mammogram for malignant neoplasm of breast - Plan: MM 3D SCREENING MAMMOGRAM BILATERAL BREAST  Benign essential HTN - Plan: CBC with Differential/Platelet, Comprehensive metabolic panel with GFR Patient blood pressure today is adequately controlled but I will do a CBC and a CMP.  I will schedule the patient for mammogram.  I refilled her Flexeril  but I cautioned her about dizziness and sedation and confusion.  I also refilled her Xanax .  She refuses all vaccinations.

## 2024-05-13 ENCOUNTER — Ambulatory Visit: Payer: Self-pay | Admitting: Family Medicine

## 2024-05-13 LAB — CBC WITH DIFFERENTIAL/PLATELET
Absolute Lymphocytes: 1004 {cells}/uL (ref 850–3900)
Absolute Monocytes: 416 {cells}/uL (ref 200–950)
Basophils Absolute: 31 {cells}/uL (ref 0–200)
Basophils Relative: 0.6 %
Eosinophils Absolute: 78 {cells}/uL (ref 15–500)
Eosinophils Relative: 1.5 %
HCT: 40.6 % (ref 35.0–45.0)
Hemoglobin: 13.1 g/dL (ref 11.7–15.5)
MCH: 32.7 pg (ref 27.0–33.0)
MCHC: 32.3 g/dL (ref 32.0–36.0)
MCV: 101.2 fL — ABNORMAL HIGH (ref 80.0–100.0)
MPV: 9.4 fL (ref 7.5–12.5)
Monocytes Relative: 8 %
Neutro Abs: 3671 {cells}/uL (ref 1500–7800)
Neutrophils Relative %: 70.6 %
Platelets: 262 Thousand/uL (ref 140–400)
RBC: 4.01 Million/uL (ref 3.80–5.10)
RDW: 12.8 % (ref 11.0–15.0)
Total Lymphocyte: 19.3 %
WBC: 5.2 Thousand/uL (ref 3.8–10.8)

## 2024-05-13 LAB — COMPREHENSIVE METABOLIC PANEL WITH GFR
AG Ratio: 1.8 (calc) (ref 1.0–2.5)
ALT: 7 U/L (ref 6–29)
AST: 13 U/L (ref 10–35)
Albumin: 4.2 g/dL (ref 3.6–5.1)
Alkaline phosphatase (APISO): 67 U/L (ref 37–153)
BUN/Creatinine Ratio: 13 (calc) (ref 6–22)
BUN: 14 mg/dL (ref 7–25)
CO2: 26 mmol/L (ref 20–32)
Calcium: 9.9 mg/dL (ref 8.6–10.4)
Chloride: 102 mmol/L (ref 98–110)
Creat: 1.05 mg/dL — ABNORMAL HIGH (ref 0.60–0.95)
Globulin: 2.4 g/dL (ref 1.9–3.7)
Glucose, Bld: 93 mg/dL (ref 65–99)
Potassium: 4.3 mmol/L (ref 3.5–5.3)
Sodium: 136 mmol/L (ref 135–146)
Total Bilirubin: 0.8 mg/dL (ref 0.2–1.2)
Total Protein: 6.6 g/dL (ref 6.1–8.1)
eGFR: 53 mL/min/1.73m2 — ABNORMAL LOW (ref >=60)

## 2024-06-05 ENCOUNTER — Ambulatory Visit: Payer: Medicare HMO | Admitting: *Deleted

## 2024-06-05 VITALS — Ht 69.0 in | Wt 187.0 lb

## 2024-06-05 DIAGNOSIS — Z0000 Encounter for general adult medical examination without abnormal findings: Secondary | ICD-10-CM

## 2024-06-05 NOTE — Progress Notes (Signed)
 Subjective:   Yvonne Rich is a 83 y.o. female who presents for Medicare Annual (Subsequent) preventive examination.  Visit Complete: Virtual I connected with  Lonell JAYSON Stordahl on 06/05/24 by a audio enabled telemedicine application and verified that I am speaking with the correct person using two identifiers.  Patient Location: Home  Provider Location: Home Office  I discussed the limitations of evaluation and management by telemedicine. The patient expressed understanding and agreed to proceed.  Vital Signs: Because this visit was a virtual/telehealth visit, some criteria may be missing or patient reported. Any vitals not documented were not able to be obtained and vitals that have been documented are patient reported.    Cardiac Risk Factors include: advanced age (>83men, >73 women);obesity (BMI >30kg/m2)     Objective:    Today's Vitals   06/05/24 1152  Weight: 187 lb (84.8 kg)  Height: 5' 9 (1.753 m)   Body mass index is 27.62 kg/m.     06/05/2024   11:52 AM 05/31/2023   11:52 AM 07/25/2022    6:48 PM 07/24/2022   11:14 AM 04/29/2021    3:39 PM 10/14/2016    4:00 AM  Advanced Directives  Does Patient Have a Medical Advance Directive? Yes Yes  No No No   Type of Estate agent of [employer] Power of [place];Living will      Copy of Healthcare Power of Attorney in Chart? No - copy requested No - copy requested      Would patient like information on creating a medical advance directive?   No - Patient declined  No - Patient declined No - Patient declined      Data saved with a previous flowsheet row definition    Current Medications (verified) Outpatient Encounter Medications as of 06/05/2024  Medication Sig   albuterol  (PROAIR  HFA) 108 (90 Base) MCG/ACT inhaler Inhale 2 puffs into the lungs every 6 (six) hours as needed for wheezing or shortness of breath.   ALPRAZolam  (XANAX ) 0.5 MG tablet Take 1 tablet (0.5 mg total) by mouth  at bedtime as needed for anxiety.   cyclobenzaprine  (FLEXERIL ) 5 MG tablet Take 1 tablet (5 mg total) by mouth in the morning and at bedtime.   loratadine  (CLARITIN ) 10 MG tablet Take 10 mg by mouth daily.   losartan  (COZAAR ) 100 MG tablet TAKE ONE TABLET BY MOUTH ONCE DAILY   acetaminophen  (TYLENOL ) 325 MG tablet Take 2 tablets (650 mg total) by mouth every 6 (six) hours as needed for mild pain (or Fever >/= 101). (Patient not taking: Reported on 06/05/2024)   amLODipine  (NORVASC ) 5 MG tablet TAKE ONE TABLET (5 MG TOTAL) BY MOUTH DAILY. (Patient not taking: Reported on 06/05/2024)   aspirin  81 MG tablet Take 81 mg by mouth daily. (Patient not taking: Reported on 06/05/2024)   benzonatate  (TESSALON ) 100 MG capsule TAKE 2 CAPSULES BY MOUTH 3 TIMES DAILY AS NEEDED FOR COUGH (Patient not taking: Reported on 06/05/2024)   cyanocobalamin (VITAMIN B12) 1000 MCG tablet Take 1,000 mcg by mouth daily. (Patient not taking: Reported on 06/05/2024)   dimenhyDRINATE (DRAMAMINE) 50 MG tablet Take 50 mg by mouth every 8 (eight) hours as needed for nausea. (Patient not taking: Reported on 06/05/2024)   doxazosin  (CARDURA ) 2 MG tablet TAKE ONE TABLET BY MOUTH ONCE DAILY. STOP AMLODIPINE !   guaiFENesin  (MUCINEX ) 600 MG 12 hr tablet Take 600 mg by mouth 2 (two) times daily as needed for cough. (Patient not taking: Reported on 06/05/2024)  HYDROcodone  bit-homatropine (HYCODAN) 5-1.5 MG/5ML syrup TAKE 5 MLS BY MOUTH EVERY 8 HOURS AS NEEDED FOR COUGH. (Patient not taking: Reported on 06/05/2024)   oxymetazoline (AFRIN) 0.05 % nasal spray Place 1 spray into both nostrils 2 (two) times daily as needed for congestion. (Patient not taking: Reported on 06/05/2024)   Throat Lozenges (COUGH DROPS MT) Use as directed 1 lozenge in the mouth or throat 3 (three) times daily as needed (cough). (Patient not taking: Reported on 06/05/2024)   Vitamin D, Cholecalciferol, 1000 units CAPS Take 2,000 Units by mouth daily.  (Patient not taking:  Reported on 06/05/2024)   No facility-administered encounter medications on file as of 06/05/2024.    Allergies (verified) Latex, Penicillins, Erythromycin, and Sulfonamide derivatives   History: Past Medical History:  Diagnosis Date   Allergy    Asthma    CKD (chronic kidney disease) stage 3, GFR 30-59 ml/min (HCC)    Glaucoma    Hypertension    Poliomyelitis    Sciatica of right side    Past Surgical History:  Procedure Laterality Date   CHOLECYSTECTOMY  2002   COSMETIC SURGERY  2012   eye lids   ROTATOR CUFF REPAIR  1994   Family History  Problem Relation Age of Onset   Hypertension Father    Vision loss Father    Social History   Socioeconomic History   Marital status: Widowed    Spouse name: Not on file   Number of children: Not on file   Years of education: Not on file   Highest education level: Not on file  Occupational History   Occupation: retired  Tobacco Use   Smoking status: Never   Smokeless tobacco: Never  Substance and Sexual Activity   Alcohol use: No   Drug use: No   Sexual activity: Yes  Other Topics Concern   Not on file  Social History Narrative   Not on file   Social Drivers of Health   Financial Resource Strain: Low Risk  (06/05/2024)   Overall Financial Resource Strain (CARDIA)    Difficulty of Paying Living Expenses: Not hard at all  Food Insecurity: No Food Insecurity (06/05/2024)   Hunger Vital Sign    Worried About Running Out of Food in the Last Year: Never true    Ran Out of Food in the Last Year: Never true  Transportation Needs: No Transportation Needs (06/05/2024)   PRAPARE - Administrator, Civil Service (Medical): No    Lack of Transportation (Non-Medical): No  Physical Activity: Inactive (06/05/2024)   Exercise Vital Sign    Days of Exercise per Week: 0 days    Minutes of Exercise per Session: 0 min  Stress: No Stress Concern Present (06/05/2024)   [employer] of Occupational Health - Occupational  Stress Questionnaire    Feeling of Stress: Not at all  Social Connections: Moderately Isolated (06/05/2024)   Social Connection and Isolation Panel    Frequency of Communication with Friends and Family: Twice a week    Frequency of Social Gatherings with Friends and Family: Three times a week    Attends Religious Services: More than 4 times per year    Active Member of Clubs or Organizations: No    Attends Banker Meetings: Never    Marital Status: Widowed    Tobacco Counseling Counseling given: Not Answered   Clinical Intake:  Pre-visit preparation completed: Yes  Pain : No/denies pain     Diabetes: No  How often  do you need to have someone help you when you read instructions, pamphlets, or other written materials from your doctor or pharmacy?: 1 - Never  Interpreter Needed?: No  Information entered by :: Mliss Graff LPN   Activities of Daily Living    06/05/2024   11:55 AM  In your present state of health, do you have any difficulty performing the following activities:  Hearing? 1  Vision? 0  Difficulty concentrating or making decisions? 0  Walking or climbing stairs? 1  Dressing or bathing? 0  Doing errands, shopping? 0  Preparing Food and eating ? N  Using the Toilet? N  In the past six months, have you accidently leaked urine? N  Do you have problems with loss of bowel control? N  Managing your Medications? N  Managing your Finances? N  Housekeeping or managing your Housekeeping? N    Patient Care Team: Duanne Butler DASEN, MD as PCP - General (Family Medicine) Duanne Butler DASEN, MD (Family Medicine) [facility], [facility] Dietrich Alyce BROCKS, MD as Referring Physician (Dermatology)  Indicate any recent Medical Services you may have received from other than [facility] providers in the past year (date may be approximate).     Assessment:   This is a routine wellness examination for Deairra.  Hearing/Vision screen Hearing Screening - Comments::  Bilateral hearing aids Vision Screening - Comments:: [facility] Up to date   Goals Addressed             This Visit's Progress    Patient Stated       Stay independent     Remain active and independent   On track      Depression Screen    06/05/2024   11:57 AM 05/12/2024   10:27 AM 05/31/2023   11:31 AM 08/04/2022   12:16 PM 04/29/2021    3:37 PM 01/26/2020   10:35 AM 12/28/2017    3:36 PM  PHQ 2/9 Scores  PHQ - 2 Score 0 0 0 1 0 0 0  PHQ- 9 Score 0          Fall Risk    06/05/2024   12:05 PM 05/12/2024   10:27 AM 05/31/2023   11:38 AM 04/29/2021    3:37 PM 01/26/2020   10:35 AM  Fall Risk   Falls in the past year? 0 0 0 0 0   Number falls in past yr: 0 0 0 0   Injury with Fall? 0 0 0 0   Risk for fall due to :  No Fall Risks No Fall Risks No Fall Risks   Follow up Falls evaluation completed;Education provided;Falls prevention discussed Falls evaluation completed Falls prevention discussed;Education provided;Falls evaluation completed Falls evaluation completed  Falls evaluation completed      Data saved with a previous flowsheet row definition    MEDICARE RISK AT HOME: Medicare Risk at Home Any stairs in or around the home?: Yes If so, are there any without handrails?: No Home free of loose throw rugs in walkways, pet beds, electrical cords, etc?: Yes Adequate lighting in your home to reduce risk of falls?: Yes Life alert?: No Use of a cane, walker or w/c?: No Grab bars in the bathroom?: Yes Shower chair or bench in shower?: Yes Elevated toilet seat or a handicapped toilet?: Yes  TIMED UP AND GO:  Was the test performed?  No    Cognitive Function:        06/05/2024   11:55 AM 05/31/2023  11:52 AM  6CIT Screen  What Year? 0 points 0 points  What month? 0 points 0 points  What time? 0 points 0 points  Count back from 20 2 points 0 points  Months in reverse 0 points 0 points  Repeat phrase 2 points 0 points  Total Score 4 points 0 points     Immunizations Immunization History  Administered Date(s) Administered   INFLUENZA, HIGH DOSE SEASONAL PF 09/07/2016, 10/21/2018   Influenza,inj,Quad PF,6+ Mos 11/16/2017   Influenza-Unspecified 07/05/2012, 08/10/2015   Moderna Sars-Covid-2 Vaccination 12/05/2019, 01/02/2020   Pneumococcal Conjugate-13 12/28/2017   Pneumococcal Polysaccharide-23 10/09/2010, 05/14/2012   Td 10/16/2000   Tdap 10/13/2016    TDAP status: Up to date  Flu Vaccine status: Declined, Education has been provided regarding the importance of this vaccine but patient still declined. Advised may receive this vaccine at local pharmacy or Health Dept. Aware to provide a copy of the vaccination record if obtained from local pharmacy or Health Dept. Verbalized acceptance and understanding.  Pneumococcal vaccine status: Declined,  Education has been provided regarding the importance of this vaccine but patient still declined. Advised may receive this vaccine at local pharmacy or Health Dept. Aware to provide a copy of the vaccination record if obtained from local pharmacy or Health Dept. Verbalized acceptance and understanding.   Covid-19 vaccine status: Information provided on how to obtain vaccines.   Qualifies for Shingles Vaccine? Yes   Zostavax completed No   Shingrix Completed?: No.    Education has been provided regarding the importance of this vaccine. Patient has been advised to call insurance company to determine out of pocket expense if they have not yet received this vaccine. Advised may also receive vaccine at local pharmacy or Health Dept. Verbalized acceptance and understanding.  Screening Tests Health Maintenance  Topic Date Due   Zoster Vaccines- Shingrix (1 of 2) 08/12/2024 (Originally 09/11/1960)   INFLUENZA VACCINE  01/06/2025 (Originally 05/09/2024)   Medicare Annual Wellness (AWV)  06/05/2025   DTaP/Tdap/Td (3 - Td or Tdap) 10/13/2026   Pneumococcal Vaccine: 50+ Years  Completed   DEXA SCAN   Completed   HPV VACCINES  Aged Out   Meningococcal B Vaccine  Aged Out   COVID-19 Vaccine  Discontinued    Health Maintenance  There are no preventive care reminders to display for this patient.   Colorectal cancer screening: No longer required.   Mammogram status: Completed  . Repeat every year  Bone Density status: Completed  . Results reflect: Bone density results: OSTEOPENIA. Repeat every 2 years.  Lung Cancer Screening: (Low Dose CT Chest recommended if Age 66-80 years, 20 pack-year currently smoking OR have quit w/in 15years.) does not qualify.   Lung Cancer Screening Referral:   Additional Screening:  Hepatitis C Screening: does not qualify  Vision Screening: Recommended annual ophthalmology exams for early detection of glaucoma and other disorders of the eye. Is the patient up to date with their annual eye exam?  Yes  Who is the provider or what is the name of the office in which the patient attends annual eye exams? [facility] If pt is not established with a provider, would they like to be referred to a provider to establish care? No .   Dental Screening: Recommended annual dental exams for proper oral hygiene    Community Resource Referral / Chronic Care Management: CRR required this visit?  No   CCM required this visit?  No     Plan:  I have personally reviewed and noted the following in the patient's chart:   Medical and social history Use of alcohol, tobacco or illicit drugs  Current medications and supplements including opioid prescriptions. Patient is not currently taking opioid prescriptions. Functional ability and status Nutritional status Physical activity Advanced directives List of other physicians Hospitalizations, surgeries, and ER visits in previous 12 months Vitals Screenings to include cognitive, depression, and falls Referrals and appointments  In addition, I have reviewed and discussed with patient certain preventive  protocols, quality metrics, and best practice recommendations. A written personalized care plan for preventive services as well as general preventive health recommendations were provided to patient.     Mliss Graff, LPN   [id number]   After Visit Summary: (MyChart) Due to this being a telephonic visit, the after visit summary with patients personalized plan was offered to patient via MyChart   Nurse Notes:

## 2024-06-05 NOTE — Patient Instructions (Signed)
 Yvonne Rich , Thank you for taking time to come for your Medicare Wellness Visit. I appreciate your ongoing commitment to your health goals. Please review the following plan we discussed and let me know if I can assist you in the future.   Screening recommendations/referrals: Colonoscopy: no longer required Mammogram: up to date Bone Density: up to date Recommended yearly ophthalmology/optometry visit for glaucoma screening and checkup Recommended yearly dental visit for hygiene and checkup  Vaccinations: Influenza vaccine:  Pneumococcal vaccine:  Tdap vaccine:  Shingles vaccine:       Preventive Care 65 Years and Older, Female Preventive care refers to lifestyle choices and visits with your health care provider that can promote health and wellness. What does preventive care include? A yearly physical exam. This is also called an annual well check. Dental exams once or twice a year. Routine eye exams. Ask your health care provider how often you should have your eyes checked. Personal lifestyle choices, including: Daily care of your teeth and gums. Regular physical activity. Eating a healthy diet. Avoiding tobacco and drug use. Limiting alcohol use. Practicing safe sex. Taking low-dose aspirin  every day. Taking vitamin and mineral supplements as recommended by your health care provider. What happens during an annual well check? The services and screenings done by your health care provider during your annual well check will depend on your age, overall health, lifestyle risk factors, and family history of disease. Counseling  Your health care provider may ask you questions about your: Alcohol use. Tobacco use. Drug use. Emotional well-being. Home and relationship well-being. Sexual activity. Eating habits. History of falls. Memory and ability to understand (cognition). Work and work Astronomer. Reproductive health. Screening  You may have the following tests or  measurements: Height, weight, and BMI. Blood pressure. Lipid and cholesterol levels. These may be checked every 5 years, or more frequently if you are over 47 years old. Skin check. Lung cancer screening. You may have this screening every year starting at age 25 if you have a 30-pack-year history of smoking and currently smoke or have quit within the past 15 years. Fecal occult blood test (FOBT) of the stool. You may have this test every year starting at age 74. Flexible sigmoidoscopy or colonoscopy. You may have a sigmoidoscopy every 5 years or a colonoscopy every 10 years starting at age 57. Hepatitis C blood test. Hepatitis B blood test. Sexually transmitted disease (STD) testing. Diabetes screening. This is done by checking your blood sugar (glucose) after you have not eaten for a while (fasting). You may have this done every 1-3 years. Bone density scan. This is done to screen for osteoporosis. You may have this done starting at age 38. Mammogram. This may be done every 1-2 years. Talk to your health care provider about how often you should have regular mammograms. Talk with your health care provider about your test results, treatment options, and if necessary, the need for more tests. Vaccines  Your health care provider may recommend certain vaccines, such as: Influenza vaccine. This is recommended every year. Tetanus, diphtheria, and acellular pertussis (Tdap, Td) vaccine. You may need a Td booster every 10 years. Zoster vaccine. You may need this after age 26. Pneumococcal 13-valent conjugate (PCV13) vaccine. One dose is recommended after age 74. Pneumococcal polysaccharide (PPSV23) vaccine. One dose is recommended after age 29. Talk to your health care provider about which screenings and vaccines you need and how often you need them. This information is not intended to replace advice given  to you by your health care provider. Make sure you discuss any questions you have with your  health care provider. Document Released: 10/22/2015 Document Revised: 06/14/2016 Document Reviewed: 07/27/2015 Elsevier Interactive Patient Education  2017 [employer].  Fall Prevention in the Home Falls can cause injuries. They can happen to people of all ages. There are many things you can do to make your home safe and to help prevent falls. What can I do on the outside of my home? Regularly fix the edges of walkways and driveways and fix any cracks. Remove anything that might make you trip as you walk through a door, such as a raised step or threshold. Trim any bushes or trees on the path to your home. Use bright outdoor lighting. Clear any walking paths of anything that might make someone trip, such as rocks or tools. Regularly check to see if handrails are loose or broken. Make sure that both sides of any steps have handrails. Any raised decks and porches should have guardrails on the edges. Have any leaves, snow, or ice cleared regularly. Use sand or salt on walking paths during winter. Clean up any spills in your garage right away. This includes oil or grease spills. What can I do in the bathroom? Use night lights. Install grab bars by the toilet and in the tub and shower. Do not use towel bars as grab bars. Use non-skid mats or decals in the tub or shower. If you need to sit down in the shower, use a plastic, non-slip stool. Keep the floor dry. Clean up any water that spills on the floor as soon as it happens. Remove soap buildup in the tub or shower regularly. Attach bath mats securely with double-sided non-slip rug tape. Do not have throw rugs and other things on the floor that can make you trip. What can I do in the bedroom? Use night lights. Make sure that you have a light by your bed that is easy to reach. Do not use any sheets or blankets that are too big for your bed. They should not hang down onto the floor. Have a firm chair that has side arms. You can use this for  support while you get dressed. Do not have throw rugs and other things on the floor that can make you trip. What can I do in the kitchen? Clean up any spills right away. Avoid walking on wet floors. Keep items that you use a lot in easy-to-reach places. If you need to reach something above you, use a strong step stool that has a grab bar. Keep electrical cords out of the way. Do not use floor polish or wax that makes floors slippery. If you must use wax, use non-skid floor wax. Do not have throw rugs and other things on the floor that can make you trip. What can I do with my stairs? Do not leave any items on the stairs. Make sure that there are handrails on both sides of the stairs and use them. Fix handrails that are broken or loose. Make sure that handrails are as long as the stairways. Check any carpeting to make sure that it is firmly attached to the stairs. Fix any carpet that is loose or worn. Avoid having throw rugs at the top or bottom of the stairs. If you do have throw rugs, attach them to the floor with carpet tape. Make sure that you have a light switch at the top of the stairs and the bottom of the  stairs. If you do not have them, ask someone to add them for you. What else can I do to help prevent falls? Wear shoes that: Do not have high heels. Have rubber bottoms. Are comfortable and fit you well. Are closed at the toe. Do not wear sandals. If you use a stepladder: Make sure that it is fully opened. Do not climb a closed stepladder. Make sure that both sides of the stepladder are locked into place. Ask someone to hold it for you, if possible. Clearly mark and make sure that you can see: Any grab bars or handrails. First and last steps. Where the edge of each step is. Use tools that help you move around (mobility aids) if they are needed. These include: Canes. Walkers. Scooters. Crutches. Turn on the lights when you go into a dark area. Replace any light bulbs as soon  as they burn out. Set up your furniture so you have a clear path. Avoid moving your furniture around. If any of your floors are uneven, fix them. If there are any pets around you, be aware of where they are. Review your medicines with your doctor. Some medicines can make you feel dizzy. This can increase your chance of falling. Ask your doctor what other things that you can do to help prevent falls. This information is not intended to replace advice given to you by your health care provider. Make sure you discuss any questions you have with your health care provider. Document Released: 07/22/2009 Document Revised: 03/02/2016 Document Reviewed: 10/30/2014 Elsevier Interactive Patient Education  2017 [employer].

## 2024-06-06 ENCOUNTER — Other Ambulatory Visit: Payer: Self-pay

## 2024-06-06 ENCOUNTER — Other Ambulatory Visit: Payer: Self-pay | Admitting: Family Medicine

## 2024-06-06 DIAGNOSIS — M545 Low back pain, unspecified: Secondary | ICD-10-CM

## 2024-06-06 MED ORDER — CYCLOBENZAPRINE HCL 5 MG PO TABS: 5.0000 mg | ORAL_TABLET | Freq: Two times a day (BID) | ORAL | 0 refills | Status: DC

## 2024-06-11 ENCOUNTER — Other Ambulatory Visit: Payer: Self-pay | Admitting: Family Medicine

## 2024-06-12 NOTE — Telephone Encounter (Signed)
 Requested Prescriptions  Pending Prescriptions Disp Refills   albuterol  (VENTOLIN  HFA) 108 (90 Base) MCG/ACT inhaler [Pharmacy Med Name: ALBUTEROL  SULFATE HFA HFA AEROSOL SOLN] 8.5 g 1    Sig: INHALE TWO PUFFS INTO THE LUNGS EVERY SIX HOURS AS NEEDED FOR WHEEZING OR SHORTNESS OF BREATH     Pulmonology:  Beta Agonists 2 Passed - 06/12/2024 10:52 AM      Passed - Last BP in normal range    BP Readings from Last 1 Encounters:  05/12/24 138/70         Passed - Last Heart Rate in normal range    Pulse Readings from Last 1 Encounters:  05/12/24 73         Passed - Valid encounter within last 12 months    Recent Outpatient Visits           1 month ago Insomnia, unspecified type   [place] [facility] Duanne Butler DASEN, MD   1 year ago Benign essential HTN   [place] [facility] Duanne Butler DASEN, MD   1 year ago Hyponatremia   [place] [facility] Duanne Butler DASEN, MD   2 years ago General medical exam   [place] [facility] [place], Butler DASEN, MD

## 2024-07-03 ENCOUNTER — Other Ambulatory Visit: Payer: Self-pay | Admitting: Family Medicine

## 2024-07-03 DIAGNOSIS — M545 Low back pain, unspecified: Secondary | ICD-10-CM

## 2024-07-30 ENCOUNTER — Other Ambulatory Visit: Payer: Self-pay | Admitting: Family Medicine

## 2024-07-31 ENCOUNTER — Other Ambulatory Visit: Payer: Self-pay | Admitting: Family Medicine

## 2024-08-04 ENCOUNTER — Other Ambulatory Visit: Payer: Self-pay | Admitting: Family Medicine

## 2024-08-04 DIAGNOSIS — G4700 Insomnia, unspecified: Secondary | ICD-10-CM

## 2024-08-04 DIAGNOSIS — G8929 Other chronic pain: Secondary | ICD-10-CM

## 2024-08-06 DIAGNOSIS — J45909 Unspecified asthma, uncomplicated: Secondary | ICD-10-CM | POA: Diagnosis not present

## 2024-08-06 DIAGNOSIS — B0229 Other postherpetic nervous system involvement: Secondary | ICD-10-CM | POA: Diagnosis not present

## 2024-08-06 DIAGNOSIS — E785 Hyperlipidemia, unspecified: Secondary | ICD-10-CM | POA: Diagnosis not present

## 2024-08-06 DIAGNOSIS — Z9181 History of falling: Secondary | ICD-10-CM | POA: Diagnosis not present

## 2024-08-06 DIAGNOSIS — F419 Anxiety disorder, unspecified: Secondary | ICD-10-CM | POA: Diagnosis not present

## 2024-08-06 DIAGNOSIS — I4430 Unspecified atrioventricular block: Secondary | ICD-10-CM | POA: Diagnosis not present

## 2024-08-06 DIAGNOSIS — M8580 Other specified disorders of bone density and structure, unspecified site: Secondary | ICD-10-CM | POA: Diagnosis not present

## 2024-08-06 DIAGNOSIS — I10 Essential (primary) hypertension: Secondary | ICD-10-CM | POA: Diagnosis not present

## 2024-08-06 DIAGNOSIS — M4800 Spinal stenosis, site unspecified: Secondary | ICD-10-CM | POA: Diagnosis not present

## 2024-08-06 DIAGNOSIS — M1990 Unspecified osteoarthritis, unspecified site: Secondary | ICD-10-CM | POA: Diagnosis not present

## 2024-08-06 DIAGNOSIS — K219 Gastro-esophageal reflux disease without esophagitis: Secondary | ICD-10-CM | POA: Diagnosis not present

## 2024-08-26 DIAGNOSIS — L814 Other melanin hyperpigmentation: Secondary | ICD-10-CM | POA: Diagnosis not present

## 2024-08-26 DIAGNOSIS — L821 Other seborrheic keratosis: Secondary | ICD-10-CM | POA: Diagnosis not present

## 2024-08-26 DIAGNOSIS — D1801 Hemangioma of skin and subcutaneous tissue: Secondary | ICD-10-CM | POA: Diagnosis not present

## 2024-08-26 DIAGNOSIS — Z85828 Personal history of other malignant neoplasm of skin: Secondary | ICD-10-CM | POA: Diagnosis not present

## 2024-08-26 DIAGNOSIS — L570 Actinic keratosis: Secondary | ICD-10-CM | POA: Diagnosis not present

## 2024-08-26 DIAGNOSIS — Z08 Encounter for follow-up examination after completed treatment for malignant neoplasm: Secondary | ICD-10-CM | POA: Diagnosis not present

## 2024-09-02 ENCOUNTER — Other Ambulatory Visit: Payer: Self-pay | Admitting: Family Medicine

## 2024-09-02 DIAGNOSIS — M545 Low back pain, unspecified: Secondary | ICD-10-CM

## 2024-10-01 ENCOUNTER — Other Ambulatory Visit: Payer: Self-pay | Admitting: Family Medicine

## 2024-10-01 DIAGNOSIS — G8929 Other chronic pain: Secondary | ICD-10-CM

## 2024-11-03 ENCOUNTER — Other Ambulatory Visit: Payer: Self-pay | Admitting: Family Medicine

## 2024-11-03 DIAGNOSIS — G8929 Other chronic pain: Secondary | ICD-10-CM

## 2024-11-03 DIAGNOSIS — G4700 Insomnia, unspecified: Secondary | ICD-10-CM

## 2025-06-11 ENCOUNTER — Encounter
